# Patient Record
Sex: Male | Born: 1960 | Race: White | Hispanic: No | Marital: Single | Smoking: Former smoker
Health system: Southern US, Community
[De-identification: ages and names within clinical notes are randomized; demographics above are authoritative.]

## PROBLEM LIST (undated history)

## (undated) DIAGNOSIS — F419 Anxiety disorder, unspecified: Secondary | ICD-10-CM

## (undated) DIAGNOSIS — E119 Type 2 diabetes mellitus without complications: Secondary | ICD-10-CM

## (undated) DIAGNOSIS — I10 Essential (primary) hypertension: Secondary | ICD-10-CM

## (undated) HISTORY — PX: CHOLECYSTECTOMY: SHX55

## (undated) HISTORY — PX: COLOSTOMY REVERSAL: SHX5782

## (undated) HISTORY — PX: APPENDECTOMY: SHX54

---

## 2014-01-04 ENCOUNTER — Encounter (HOSPITAL_COMMUNITY): Payer: Self-pay | Admitting: Emergency Medicine

## 2014-01-04 ENCOUNTER — Observation Stay (HOSPITAL_COMMUNITY)
Admission: EM | Admit: 2014-01-04 | Discharge: 2014-01-05 | Disposition: A | Discharge status: Home / Self Care | Payer: Medicare PPO | Attending: Internal Medicine | Admitting: Internal Medicine

## 2014-01-04 ENCOUNTER — Emergency Department (HOSPITAL_COMMUNITY): Payer: Medicare PPO

## 2014-01-04 DIAGNOSIS — E119 Type 2 diabetes mellitus without complications: Secondary | ICD-10-CM | POA: Diagnosis present

## 2014-01-04 DIAGNOSIS — K219 Gastro-esophageal reflux disease without esophagitis: Secondary | ICD-10-CM | POA: Insufficient documentation

## 2014-01-04 DIAGNOSIS — R072 Precordial pain: Principal | ICD-10-CM | POA: Insufficient documentation

## 2014-01-04 DIAGNOSIS — Z79899 Other long term (current) drug therapy: Secondary | ICD-10-CM | POA: Insufficient documentation

## 2014-01-04 DIAGNOSIS — I10 Essential (primary) hypertension: Secondary | ICD-10-CM | POA: Insufficient documentation

## 2014-01-04 DIAGNOSIS — R109 Unspecified abdominal pain: Secondary | ICD-10-CM | POA: Insufficient documentation

## 2014-01-04 DIAGNOSIS — R079 Chest pain, unspecified: Secondary | ICD-10-CM | POA: Diagnosis present

## 2014-01-04 DIAGNOSIS — Z9089 Acquired absence of other organs: Secondary | ICD-10-CM | POA: Insufficient documentation

## 2014-01-04 HISTORY — DX: Essential (primary) hypertension: I10

## 2014-01-04 HISTORY — DX: Type 2 diabetes mellitus without complications: E11.9

## 2014-01-04 HISTORY — DX: Anxiety disorder, unspecified: F41.9

## 2014-01-04 LAB — HEPATIC FUNCTION PANEL
ALT: 15 U/L (ref 0–53)
AST: 16 U/L (ref 0–37)
Albumin: 3.7 g/dL (ref 3.5–5.2)
Alkaline Phosphatase: 91 U/L (ref 39–117)
Total Bilirubin: 0.4 mg/dL (ref 0.3–1.2)
Total Protein: 7.2 g/dL (ref 6.0–8.3)

## 2014-01-04 LAB — BASIC METABOLIC PANEL
BUN: 16 mg/dL (ref 6–23)
CHLORIDE: 101 meq/L (ref 96–112)
CO2: 26 meq/L (ref 19–32)
Calcium: 10.4 mg/dL (ref 8.4–10.5)
Creatinine, Ser: 0.74 mg/dL (ref 0.50–1.35)
GFR calc Af Amer: >90 mL/min (ref >90)
GFR calc non Af Amer: >90 mL/min (ref >90)
GLUCOSE: 164 mg/dL — AB (ref 70–99)
Potassium: 4.1 mEq/L (ref 3.7–5.3)
Sodium: 141 mEq/L (ref 137–147)

## 2014-01-04 LAB — I-STAT TROPONIN, ED
TROPONIN I, POC: 0 ng/mL (ref 0.00–0.08)
Troponin i, poc: 0 ng/mL (ref 0.00–0.08)

## 2014-01-04 LAB — CBC
HCT: 44.2 % (ref 39.0–52.0)
HEMOGLOBIN: 15.5 g/dL (ref 13.0–17.0)
MCH: 29.9 pg (ref 26.0–34.0)
MCHC: 35.1 g/dL (ref 30.0–36.0)
MCV: 85.3 fL (ref 78.0–100.0)
PLATELETS: 235 10*3/uL (ref 150–400)
RBC: 5.18 MIL/uL (ref 4.22–5.81)
RDW: 13.3 % (ref 11.5–15.5)
WBC: 10.1 10*3/uL (ref 4.0–10.5)

## 2014-01-04 LAB — LIPASE, BLOOD: Lipase: 67 U/L — ABNORMAL HIGH (ref 11–59)

## 2014-01-04 LAB — GLUCOSE, CAPILLARY: GLUCOSE-CAPILLARY: 140 mg/dL — AB (ref 70–99)

## 2014-01-04 LAB — D-DIMER, QUANTITATIVE: D-Dimer, Quant: <0.27 ug/mL-FEU (ref 0.00–0.48)

## 2014-01-04 MED ORDER — GI COCKTAIL ~~LOC~~: 30.0000 mL | Freq: Four times a day (QID) | ORAL | Status: DC | PRN

## 2014-01-04 MED ORDER — ASPIRIN EC 325 MG PO TBEC
325.0000 mg | DELAYED_RELEASE_TABLET | Freq: Every day | ORAL | Status: DC
  Administered 2014-01-05 (×2): 325 mg via ORAL
  Filled 2014-01-04 (×2): qty 1

## 2014-01-04 MED ORDER — NITROGLYCERIN 2 % TD OINT
1.0000 [in_us] | TOPICAL_OINTMENT | Freq: Four times a day (QID) | TRANSDERMAL | Status: DC
  Administered 2014-01-04 – 2014-01-05 (×3): 1 [in_us] via TOPICAL
  Filled 2014-01-04: qty 30
  Filled 2014-01-04: qty 1

## 2014-01-04 MED ORDER — FAMOTIDINE 20 MG PO TABS
20.0000 mg | ORAL_TABLET | Freq: Once | ORAL | Status: AC
  Administered 2014-01-04: 20 mg via ORAL
  Filled 2014-01-04: qty 1

## 2014-01-04 MED ORDER — LISINOPRIL 20 MG PO TABS
20.0000 mg | ORAL_TABLET | Freq: Every day | ORAL | Status: DC
  Administered 2014-01-05: 20 mg via ORAL
  Filled 2014-01-04: qty 1

## 2014-01-04 MED ORDER — MORPHINE SULFATE 4 MG/ML IJ SOLN: 4.0000 mg | INTRAMUSCULAR | Status: DC | PRN

## 2014-01-04 MED ORDER — GI COCKTAIL ~~LOC~~
30.0000 mL | Freq: Once | ORAL | Status: AC
  Administered 2014-01-04: 30 mL via ORAL
  Filled 2014-01-04: qty 30

## 2014-01-04 MED ORDER — NITROGLYCERIN 0.4 MG SL SUBL
0.4000 mg | SUBLINGUAL_TABLET | SUBLINGUAL | Status: DC | PRN
  Administered 2014-01-04: 0.4 mg via SUBLINGUAL

## 2014-01-04 MED ORDER — MORPHINE SULFATE 2 MG/ML IJ SOLN: 2.0000 mg | INTRAMUSCULAR | Status: DC | PRN

## 2014-01-04 MED ORDER — GI COCKTAIL ~~LOC~~
30.0000 mL | Freq: Once | ORAL | Status: DC
Start: 2014-01-04 — End: 2014-01-04

## 2014-01-04 MED ORDER — PANTOPRAZOLE SODIUM 40 MG PO TBEC
40.0000 mg | DELAYED_RELEASE_TABLET | Freq: Every day | ORAL | Status: DC
  Administered 2014-01-05 (×2): 40 mg via ORAL
  Filled 2014-01-04 (×2): qty 1

## 2014-01-04 MED ORDER — MORPHINE SULFATE 4 MG/ML IJ SOLN
6.0000 mg | Freq: Once | INTRAMUSCULAR | Status: DC
  Filled 2014-01-04: qty 2

## 2014-01-04 NOTE — ED Notes (Signed)
Attempted report 

## 2014-01-04 NOTE — H&P (Signed)
Chief Complaint:  Chest pain  HPI: 53 yo male h/o dm, htn comes in with sscp/epigastric pain that he normally has usually relieved with rolaids, but this time it was more severe and would not go away.  No radiation.  No n/v.  No cough or sob.  More of a burning sensation.  Pain was relieved some by ntg sublingual than totally relieved after a gi cocktail.  He has heartburn frequently and does not take anything but tums for it.  No swelling in his legs, no calf pain, no edema.  He is pain free now.    Review of Systems:  Positive and negative as per HPI otherwise all other systems are negative  Past Medical History: Past Medical History  Diagnosis Date  . Hypertension   . Diabetes mellitus without complication    Past Surgical History  Procedure Laterality Date  . Cholecystectomy    . Appendectomy    . Colostomy reversal      Medications: Prior to Admission medications   Medication Sig Start Date End Date Taking? Authorizing Provider  BUSPIRONE HCL PO Take 1 tablet by mouth 3 (three) times daily.   Yes Historical Provider, MD  Ca Carbonate-Mag Hydroxide (ROLAIDS PO) Take 2 tablets by mouth daily as needed (acid indigestion).   Yes Historical Provider, MD  ClonazePAM (KLONOPIN PO) Take 1 tablet by mouth at bedtime.   Yes Historical Provider, MD  KRILL OIL PO Take 1 tablet by mouth daily.   Yes Historical Provider, MD  lisinopril (PRINIVIL,ZESTRIL) 20 MG tablet Take 20 mg by mouth daily.   Yes Historical Provider, MD  METFORMIN HCL PO Take 1 tablet by mouth 2 (two) times daily.   Yes Historical Provider, MD  PRESCRIPTION MEDICATION Take 1 tablet by mouth at bedtime. For nerves   Yes Historical Provider, MD    Allergies:  No Known Allergies  Social History:  reports that he has never smoked. He does not have any smokeless tobacco history on file. He reports that he does not drink alcohol or use illicit drugs.  Family History: none  Physical Exam: Filed Vitals:   01/04/14  1830 01/04/14 1845 01/04/14 1900 01/04/14 1919  BP: 184/94 177/98 182/102 159/95  Pulse: 106 114 114 112  Temp:    99 F (37.2 C)  TempSrc:      Resp: 21 22 23 25   Height:      Weight:      SpO2: 95% 96% 97% 97%   General appearance: alert, cooperative and no distress Head: Normocephalic, without obvious abnormality, atraumatic Eyes: negative Nose: Nares normal. Septum midline. Mucosa normal. No drainage or sinus tenderness. Neck: no JVD and supple, symmetrical, trachea midline Lungs: clear to auscultation bilaterally Heart: regular rate and rhythm, S1, S2 normal, no murmur, click, rub or gallop Abdomen: soft, non-tender; bowel sounds normal; no masses,  no organomegaly Extremities: extremities normal, atraumatic, no cyanosis or edema Pulses: 2+ and symmetric Skin: Skin color, texture, turgor normal. No rashes or lesions Neurologic: Grossly normal  Labs on Admission:   Recent Labs  01/04/14 1807  NA 141  K 4.1  CL 101  CO2 26  GLUCOSE 164*  BUN 16  CREATININE 0.74  CALCIUM 10.4    Recent Labs  01/04/14 1807  WBC 10.1  HGB 15.5  HCT 44.2  MCV 85.3  PLT 235    Radiological Exams on Admission: Dg Chest 2 View  01/04/2014   CLINICAL DATA:  Chest pain.  EXAM: CHEST  2  VIEW  COMPARISON:  None.  FINDINGS: Cardiopericardial silhouette within normal limits. Mediastinal contours normal. Trachea midline. No airspace disease or effusion. Monitoring leads project over the chest. Linear scarring or atelectasis is present over the cardiac apex. Cholecystectomy clips are present in the right upper quadrant.  IMPRESSION: No active cardiopulmonary disease.   Electronically Signed   By: Andreas NewportGeoffrey  Lamke M.D.   On: 01/04/2014 19:18    Assessment/Plan  53 yo male with atypical chest pain with multiple risk factors for underlying CAD  Principal Problem:   Chest pain-  obs for romi.  Already has 2 sets of trop that are 0.  ekg no acute changes.  Ck echo in am.  Then d/c with  further outpt stress testing.  Suspect this is GI related,  Will ck lfts/lipase.  Place on protonix orally.  Active Problems:  Stable unless o/w noted:   Diabetes mellitus without complication   Hypertension   GERD (gastroesophageal reflux disease)  obs on tele.  Full code.  Bon Dowis A 01/04/2014, 8:36 PM

## 2014-01-04 NOTE — ED Notes (Signed)
Received pt from home with c/o epigastric/chest pain onset today. Pt tried Rolaids without relief. Pt initial BP 238/120 for EMS. Pt given 324 Mg of ASA and 1 Nitro by EMS. BP decreased to 160/88.

## 2014-01-04 NOTE — ED Notes (Signed)
Pt has been pain free since GI cocktail.  Dr Onalee Huaavid in room with pt.

## 2014-01-04 NOTE — ED Provider Notes (Signed)
CSN: [id number]     Arrival date & time 01/04/14  1724 History   First MD Initiated Contact with Patient 01/04/14 1731     Chief Complaint  Patient presents with  . Chest Pain  . Abdominal Pain     (Consider location/radiation/quality/duration/timing/severity/associated sxs/prior Treatment) Patient is a 53 y.o. male presenting with chest pain.  Chest Pain Pain location:  Substernal area Pain quality: pressure and sharp   Pain radiates to:  Does not radiate Pain radiates to the back: no   Pain severity:  Mild Onset quality:  Gradual Duration:  60 minutes Timing:  Constant Chronicity:  New Context: not breathing, not eating and not at rest   Relieved by:  None tried Worsened by:  Nothing tried Ineffective treatments:  Antacids Associated symptoms: no abdominal pain, no back pain, no cough, no dizziness, no fatigue, no fever, no headache, no numbness, no orthopnea, no palpitations, no shortness of breath and not vomiting     Past Medical History  Diagnosis Date  . Hypertension   . Diabetes mellitus without complication    Past Surgical History  Procedure Laterality Date  . Cholecystectomy    . Appendectomy    . Colostomy reversal     No family history on file. History  Substance Use Topics  . Smoking status: Never Smoker   . Smokeless tobacco: Not on file  . Alcohol Use: No    Review of Systems  Constitutional: Negative for fever, chills and fatigue.  HENT: Negative for congestion and rhinorrhea.   Eyes: Negative for pain.  Respiratory: Negative for cough and shortness of breath.   Cardiovascular: Positive for chest pain. Negative for palpitations and orthopnea.  Gastrointestinal: Negative for vomiting, abdominal pain, diarrhea and constipation.  Endocrine: Negative for polydipsia and polyuria.  Genitourinary: Negative for dysuria and flank pain.  Musculoskeletal: Negative for back pain and neck pain.  Skin: Negative for color change and wound.  Neurological:  Negative for dizziness, numbness and headaches.      Allergies  Review of patient's allergies indicates no known allergies.  Home Medications   Prior to Admission medications   Medication Sig Start Date End Date Taking? Authorizing Provider  BUSPIRONE HCL PO Take 1 tablet by mouth 3 (three) times daily.   Yes Historical Provider, MD  Ca Carbonate-Mag Hydroxide (ROLAIDS PO) Take 2 tablets by mouth daily as needed (acid indigestion).   Yes Historical Provider, MD  ClonazePAM (KLONOPIN PO) Take 1 tablet by mouth at bedtime.   Yes Historical Provider, MD  KRILL OIL PO Take 1 tablet by mouth daily.   Yes Historical Provider, MD  lisinopril (PRINIVIL,ZESTRIL) 20 MG tablet Take 20 mg by mouth daily.   Yes Historical Provider, MD  METFORMIN HCL PO Take 1 tablet by mouth 2 (two) times daily.   Yes Historical Provider, MD  PRESCRIPTION MEDICATION Take 1 tablet by mouth at bedtime. For nerves   Yes Historical Provider, MD   BP 159/95  Pulse 112  Temp(Src) 99 F (37.2 C) (Oral)  Resp 25  Ht 5\' 8"  (1.727 m)  Wt 184 lb (83.462 kg)  BMI 27.98 kg/m2  SpO2 97% Physical Exam  Nursing note and vitals reviewed. Constitutional: He is oriented to person, place, and time. He appears well-developed and well-nourished.  HENT:  Head: Normocephalic and atraumatic.  Eyes: Conjunctivae and EOM are normal. Pupils are equal, round, and reactive to light.  Neck: Normal range of motion.  Cardiovascular: Normal rate and regular rhythm.   Pulmonary/Chest:  Effort normal and breath sounds normal.  Abdominal: Soft. He exhibits no distension. There is no tenderness.  Musculoskeletal: Normal range of motion. He exhibits no edema and no tenderness.  Neurological: He is alert and oriented to person, place, and time.  Skin: Skin is warm and dry.    ED Course  Procedures (including critical care time) Labs Review Labs Reviewed  BASIC METABOLIC PANEL - Abnormal; Notable for the following:    Glucose, Bld 164 (*)     All other components within normal limits  CBC  D-DIMER, QUANTITATIVE  I-STAT TROPOININ, ED  Rosezena SensorI-STAT TROPOININ, ED    Imaging Review Dg Chest 2 View  01/04/2014   CLINICAL DATA:  Chest pain.  EXAM: CHEST  2 VIEW  COMPARISON:  None.  FINDINGS: Cardiopericardial silhouette within normal limits. Mediastinal contours normal. Trachea midline. No airspace disease or effusion. Monitoring leads project over the chest. Linear scarring or atelectasis is present over the cardiac apex. Cholecystectomy clips are present in the right upper quadrant.  IMPRESSION: No active cardiopulmonary disease.   Electronically Signed   By: Andreas NewportGeoffrey  Lamke M.D.   On: 01/04/2014 19:18     EKG Interpretation   Date/Time:  Sunday January 04 2014 17:34:13 EDT Ventricular Rate:  106 PR Interval:  126 QRS Duration: 94 QT Interval:  327 QTC Calculation: 434 R Axis:   -7 Text Interpretation:  Sinus tachycardia Abnormal R-wave progression, early  transition Confirmed by ZAVITZ  MD, JOSHUA ([id number]) on 01/04/2014 5:58:26 PM      MDM   Final diagnoses:  None    53 yo w/ h/o HTN, HLD, DM here with substernal cp not relieved by antacids. No n/v, sob, diaphoresis. Similar to previous heartburn but not relieved and slightly worse. Appears well, tachycardic and tachypneic, d dimer negative. ecg sinus tach. cxr normal. Pain improved with ntg. Went away with morphine and GI cocktail. Likely GI, however with multiple risk factors, low risk acs rule out seems appropriate. Admitted to hospitalist.     Marily MemosJason Kyndel Egger, MD 01/05/14 0100

## 2014-01-04 NOTE — ED Notes (Signed)
Report to CN on 3W

## 2014-01-05 DIAGNOSIS — I059 Rheumatic mitral valve disease, unspecified: Secondary | ICD-10-CM

## 2014-01-05 LAB — GLUCOSE, CAPILLARY
GLUCOSE-CAPILLARY: 125 mg/dL — AB (ref 70–99)
GLUCOSE-CAPILLARY: 164 mg/dL — AB (ref 70–99)

## 2014-01-05 MED ORDER — ASPIRIN 325 MG PO TBEC: 325.0000 mg | DELAYED_RELEASE_TABLET | Freq: Every day | ORAL | Status: AC

## 2014-01-05 MED ORDER — PANTOPRAZOLE SODIUM 40 MG PO TBEC: 40.0000 mg | DELAYED_RELEASE_TABLET | Freq: Every day | ORAL | Status: AC

## 2014-01-05 NOTE — Progress Notes (Signed)
2D Echocardiogram Complete.  01/05/2014   Anacarolina Evelyn, RDCS  

## 2014-01-05 NOTE — Discharge Instructions (Signed)
Gastroesophageal Reflux Disease, Adult Gastroesophageal reflux disease (GERD) happens when acid from your stomach goes into your food pipe (esophagus). The acid can cause a burning feeling in your chest. Over time, the acid can make small holes (ulcers) in your food pipe.  HOME CARE  Ask your doctor for advice about:  Losing weight.  Quitting smoking.  Alcohol use.  Avoid foods and drinks that make your problems worse. You may want to avoid:  Caffeine and alcohol.  Chocolate.  Mints.  Garlic and onions.  Spicy foods.  Citrus fruits, such as oranges, lemons, or limes.  Foods that contain tomato, such as sauce, chili, salsa, and pizza.  Fried and fatty foods.  Avoid lying down for 3 hours before you go to bed or before you take a nap.  Eat small meals often, instead of large meals.  Wear loose-fitting clothing. Do not wear anything tight around your waist.  Raise (elevate) the head of your bed 6 to 8 inches with wood blocks. Using extra pillows does not help.  Only take medicines as told by your doctor.  Do not take aspirin or ibuprofen. GET HELP RIGHT AWAY IF:   You have pain in your arms, neck, jaw, teeth, or back.  Your pain gets worse or changes.  You feel sick to your stomach (nauseous), throw up (vomit), or sweat (diaphoresis).  You feel short of breath, or you pass out (faint).  Your throw up is green, yellow, black, or looks like coffee grounds or blood.  Your poop (stool) is red, bloody, or black. MAKE SURE YOU:   Understand these instructions.  Will watch your condition.  Will get help right away if you are not doing well or get worse. Document Released: 12/27/2007 Document Revised: 10/02/2011 Document Reviewed: 01/27/2011 [facility] Patient Information 2014 [place], [place].    Chest Pain (Nonspecific) Chest pain has many causes. Your pain could be caused by something serious, such as a heart attack or a blood clot in the lungs. It could also  be caused by something less serious, such as a chest bruise or a virus. Follow up with your doctor. More lab tests or other studies may be needed to find the cause of your pain. Most of the time, nonspecific chest pain will improve within 2 to 3 days of rest and mild pain medicine. HOME CARE  For chest bruises, you may put ice on the sore area for 15-20 minutes, 03-04 times a day. Do this only if it makes you feel better.  Put ice in a plastic bag.  Place a towel between the skin and the bag.  Rest for the next 2 to 3 days.  Go back to work if the pain improves.  See your doctor if the pain lasts longer than 1 to 2 weeks.  Only take medicine as told by your doctor.  Quit smoking if you smoke. GET HELP RIGHT AWAY IF:   There is more pain or pain that spreads to the arm, neck, jaw, back, or belly (abdomen).  You have shortness of breath.  You cough more than usual or cough up blood.  You have very bad back or belly pain, feel sick to your stomach (nauseous), or throw up (vomit).  You have very bad weakness.  You pass out (faint).  You have a fever. Any of these problems may be serious and may be an emergency. Do not wait to see if the problems will go away. Get medical help right away. Call your  local emergency services 911 in [place] Do not drive yourself to the hospital. MAKE SURE YOU:   Understand these instructions.  Will watch this condition.  Will get help right away if you or your child is not doing well or gets worse. Document Released: 12/27/2007 Document Revised: 10/02/2011 Document Reviewed: 12/27/2007 [facility] Patient Information 2014 [place], [place].

## 2014-01-05 NOTE — Progress Notes (Signed)
Reviewed discharge instructions with patient and family, they stated their understanding.  Discharged home with family via wheelchair.  Colman Caterarpley, Tally Mattox Danielle

## 2014-01-05 NOTE — Progress Notes (Signed)
Chest pain rounds: No further chest discomfort.  Cardiac enzymes remained negative.  EKG shows no acute ischemic ST changes although he has unusually prominent R wave in V1.  Cardiac exam shows clear lung fields and no murmur gallop rub or click.  Agree with plan for echocardiogram and then home with subsequent outpatient treadmill Myoview stress test

## 2014-01-05 NOTE — Discharge Summary (Signed)
Physician Discharge Summary  David Holloway ZOX:[id number] DOB: 01/28/1961 DOA: 01/04/2014  PCP: No primary provider on file.  Admit date: 01/04/2014 Discharge date: 01/05/2014  Time spent: 35 minutes  Recommendations for Outpatient Follow-up:  1. Follow up with PCP in 1-2 weeks 2. Follow up for outpatient stress test 3. May consider outpatient referral to GI for GERD  Discharge Diagnoses:  Principal Problem:   Chest pain Active Problems:   Diabetes mellitus without complication   Hypertension   GERD (gastroesophageal reflux disease)   Discharge Condition: Stable  Diet recommendation: Diabetic  Filed Weights   01/04/14 1737 01/04/14 2158  Weight: 184 lb (83.462 kg) 183 lb 9.6 oz (83.28 kg)    History of present illness:  53 yo male h/o dm, htn comes in with sscp/epigastric pain that he normally has usually relieved with rolaids, but this time it was more severe and would not go away. No radiation. No n/v. No cough or sob. More of a burning sensation. Pain was relieved some by ntg sublingual than totally relieved after a gi cocktail. He has heartburn frequently and does not take anything but tums for it. No swelling in his legs, no calf pain, no edema. He is pain free now.   Hospital Course:  The patient was admitted to the floor. Cardiac enzymes were serially negative. Cardiology was consulted with recommendations for outpatient stress test if 2D echo unremarkable. 2D echo was unremarkable with EF of 50-55% with no wall motion abnormality.The patient remained medically stable for discharge and close follow up. He will be discharged with a PPI given concerns of possible GERD related chest pain.  Procedures:  2D echo 6/15  Consultations:  Cardiology  Discharge Exam: Filed Vitals:   01/04/14 2158 01/05/14 0500 01/05/14 1025 01/05/14 1327  BP: 157/88 125/77 149/78 150/72  Pulse: 86 77 83 92  Temp: 98.2 F (36.8 C) 97.6 F (36.4 C)  97.8 F (36.6 C)  TempSrc:    Oral   Resp: 18 16  18   Height: 5\' 8"  (1.727 m)     Weight: 183 lb 9.6 oz (83.28 kg)     SpO2: 97% 100%  99%    General: Awake, in nad Cardiovascular: regular, s1, s2 Respiratory: normal resp effort, no wheezing  Discharge Instructions     Medication List         aspirin 325 MG EC tablet  Take 1 tablet (325 mg total) by mouth daily.     busPIRone 5 MG tablet  Commonly known as:  BUSPAR  Take 5 mg by mouth 3 (three) times daily.     clonazePAM 0.5 MG tablet  Commonly known as:  KLONOPIN  Take 0.5 mg by mouth 2 (two) times daily.     doxepin 75 MG capsule  Commonly known as:  SINEQUAN  Take 150 mg by mouth at bedtime.     KRILL OIL PO  Take 1 tablet by mouth daily.     lisinopril 20 MG tablet  Commonly known as:  PRINIVIL,ZESTRIL  Take 20 mg by mouth daily.     metFORMIN 500 MG tablet  Commonly known as:  GLUCOPHAGE  Take 500 mg by mouth 2 (two) times daily with a meal.     pantoprazole 40 MG tablet  Commonly known as:  PROTONIX  Take 1 tablet (40 mg total) by mouth daily.     ROLAIDS PO  Take 2 tablets by mouth daily as needed (acid indigestion).       No  Known Allergies Follow-up Information   Schedule an appointment as soon as possible for a visit with follow up with your PCP in 1-2 weeks.      Follow up with You will be called to schedule an outpatient stress test.       The results of significant diagnostics from this hospitalization (including imaging, microbiology, ancillary and laboratory) are listed below for reference.    Significant Diagnostic Studies: Dg Chest 2 View  01/04/2014   CLINICAL DATA:  Chest pain.  EXAM: CHEST  2 VIEW  COMPARISON:  None.  FINDINGS: Cardiopericardial silhouette within normal limits. Mediastinal contours normal. Trachea midline. No airspace disease or effusion. Monitoring leads project over the chest. Linear scarring or atelectasis is present over the cardiac apex. Cholecystectomy clips are present in the right upper  quadrant.  IMPRESSION: No active cardiopulmonary disease.   Electronically Signed   By: Andreas NewportGeoffrey  Lamke M.D.   On: 01/04/2014 19:18    Microbiology: No results found for this or any previous visit (from the past 240 hour(s)).   Labs: Basic Metabolic Panel:  Recent Labs Lab 01/04/14 1807  NA 141  K 4.1  CL 101  CO2 26  GLUCOSE 164*  BUN 16  CREATININE 0.74  CALCIUM 10.4   Liver Function Tests:  Recent Labs Lab 01/04/14 1807  AST 16  ALT 15  ALKPHOS 91  BILITOT 0.4  PROT 7.2  ALBUMIN 3.7    Recent Labs Lab 01/04/14 1807  LIPASE 67*   No results found for this basename: AMMONIA,  in the last 168 hours CBC:  Recent Labs Lab 01/04/14 1807  WBC 10.1  HGB 15.5  HCT 44.2  MCV 85.3  PLT 235   Cardiac Enzymes: No results found for this basename: CKTOTAL, CKMB, CKMBINDEX, TROPONINI,  in the last 168 hours BNP: BNP (last 3 results) No results found for this basename: PROBNP,  in the last 8760 hours CBG:  Recent Labs Lab 01/04/14 2148 01/05/14 0736 01/05/14 1137  GLUCAP 140* 125* 164*    Signed:  CHIU, STEPHEN K  [facility] 01/05/2014, 3:35 PM

## 2014-01-05 NOTE — Progress Notes (Signed)
UR Completed.  David Holloway [name] [phone] 01/05/2014  

## 2014-01-08 NOTE — ED Provider Notes (Signed)
Medical screening examination/treatment/procedure(s) were conducted as a shared visit with non-physician practitioner(s) or resident and myself. I personally evaluated the patient during the encounter and agree with the findings and plan unless otherwise indicated.  I have personally reviewed any xrays and/ or EKG's with the provider and I agree with interpretation.  Patient with history of high blood pressure, obesity, diabetes, cholesterol presents with epigastric and lower anterior chest burning and ache stay to 3 hours prior to arrival. Patient says this feels different than previous episodes of reflux however does have mild similarities. No known CAD a recent cardiac evaluation. On exam patient well-appearing, symptoms mild at this time, abdomen soft nontender, regular rate and rhythm, lungs clear, no leg pain leg swelling. Patient tachycardic in the room without any known cause of his chest pain and low risk on embolism plan for d-dimer and cardiac workup. With 3 cardiac risk factors and high blood pressure and recent chest pain plan for observation and telemetry pending results. Mild improvement nitroglycerin we'll try GI cocktail as well.  Pt does not have his GB/  Chest pain, HTN   David SkeensJoshua M Eleanore Junio, MD 01/08/14 [phone]

## 2015-12-15 ENCOUNTER — Emergency Department (HOSPITAL_COMMUNITY): Payer: Medicare PPO

## 2015-12-15 ENCOUNTER — Encounter (HOSPITAL_COMMUNITY): Payer: Self-pay | Admitting: Emergency Medicine

## 2015-12-15 ENCOUNTER — Emergency Department (HOSPITAL_COMMUNITY)
Admission: EM | Admit: 2015-12-15 | Discharge: 2015-12-15 | Disposition: A | Discharge status: Home / Self Care | Payer: Medicare PPO | Attending: Emergency Medicine | Admitting: Emergency Medicine

## 2015-12-15 DIAGNOSIS — F419 Anxiety disorder, unspecified: Secondary | ICD-10-CM | POA: Diagnosis not present

## 2015-12-15 DIAGNOSIS — Z79899 Other long term (current) drug therapy: Secondary | ICD-10-CM | POA: Insufficient documentation

## 2015-12-15 DIAGNOSIS — Z7982 Long term (current) use of aspirin: Secondary | ICD-10-CM | POA: Insufficient documentation

## 2015-12-15 DIAGNOSIS — S62306A Unspecified fracture of fifth metacarpal bone, right hand, initial encounter for closed fracture: Secondary | ICD-10-CM | POA: Insufficient documentation

## 2015-12-15 DIAGNOSIS — Y92410 Unspecified street and highway as the place of occurrence of the external cause: Secondary | ICD-10-CM | POA: Insufficient documentation

## 2015-12-15 DIAGNOSIS — I10 Essential (primary) hypertension: Secondary | ICD-10-CM | POA: Diagnosis not present

## 2015-12-15 DIAGNOSIS — Y9389 Activity, other specified: Secondary | ICD-10-CM | POA: Insufficient documentation

## 2015-12-15 DIAGNOSIS — E119 Type 2 diabetes mellitus without complications: Secondary | ICD-10-CM | POA: Diagnosis not present

## 2015-12-15 DIAGNOSIS — S6991XA Unspecified injury of right wrist, hand and finger(s), initial encounter: Secondary | ICD-10-CM | POA: Diagnosis present

## 2015-12-15 DIAGNOSIS — Y998 Other external cause status: Secondary | ICD-10-CM | POA: Insufficient documentation

## 2015-12-15 DIAGNOSIS — Z7984 Long term (current) use of oral hypoglycemic drugs: Secondary | ICD-10-CM | POA: Insufficient documentation

## 2015-12-15 DIAGNOSIS — S60512A Abrasion of left hand, initial encounter: Secondary | ICD-10-CM | POA: Diagnosis not present

## 2015-12-15 DIAGNOSIS — S62308A Unspecified fracture of other metacarpal bone, initial encounter for closed fracture: Secondary | ICD-10-CM

## 2015-12-15 LAB — COMPREHENSIVE METABOLIC PANEL
ALK PHOS: 83 U/L (ref 38–126)
ALT: 18 U/L (ref 17–63)
AST: 16 U/L (ref 15–41)
Albumin: 3.3 g/dL — ABNORMAL LOW (ref 3.5–5.0)
Anion gap: 10 (ref 5–15)
BUN: 27 mg/dL — ABNORMAL HIGH (ref 6–20)
CALCIUM: 9 mg/dL (ref 8.9–10.3)
CO2: 24 mmol/L (ref 22–32)
Chloride: 103 mmol/L (ref 101–111)
Creatinine, Ser: 0.96 mg/dL (ref 0.61–1.24)
Glucose, Bld: 169 mg/dL — ABNORMAL HIGH (ref 65–99)
Potassium: 2.9 mmol/L — ABNORMAL LOW (ref 3.5–5.1)
SODIUM: 137 mmol/L (ref 135–145)
Total Bilirubin: 1 mg/dL (ref 0.3–1.2)
Total Protein: 6.5 g/dL (ref 6.5–8.1)

## 2015-12-15 LAB — CBC WITH DIFFERENTIAL/PLATELET
BASOS ABS: 0 10*3/uL (ref 0.0–0.1)
BASOS PCT: 0 %
EOS ABS: 0.5 10*3/uL (ref 0.0–0.7)
EOS PCT: 3 %
HCT: 43.6 % (ref 39.0–52.0)
HEMOGLOBIN: 15.7 g/dL (ref 13.0–17.0)
LYMPHS ABS: 2 10*3/uL (ref 0.7–4.0)
Lymphocytes Relative: 13 %
MCH: 29.8 pg (ref 26.0–34.0)
MCHC: 36 g/dL (ref 30.0–36.0)
MCV: 82.9 fL (ref 78.0–100.0)
Monocytes Absolute: 1.1 10*3/uL — ABNORMAL HIGH (ref 0.1–1.0)
Monocytes Relative: 7 %
NEUTROS PCT: 77 %
Neutro Abs: 11.2 10*3/uL — ABNORMAL HIGH (ref 1.7–7.7)
PLATELETS: 241 10*3/uL (ref 150–400)
RBC: 5.26 MIL/uL (ref 4.22–5.81)
RDW: 12.9 % (ref 11.5–15.5)
WBC: 14.7 10*3/uL — AB (ref 4.0–10.5)

## 2015-12-15 LAB — LIPASE, BLOOD: LIPASE: 24 U/L (ref 11–51)

## 2015-12-15 MED ORDER — FENTANYL CITRATE (PF) 100 MCG/2ML IJ SOLN
100.0000 ug | Freq: Once | INTRAMUSCULAR | Status: AC
  Administered 2015-12-15: 100 ug via INTRAVENOUS
  Filled 2015-12-15: qty 2

## 2015-12-15 MED ORDER — SODIUM CHLORIDE 0.9 % IV SOLN
Freq: Once | INTRAVENOUS | Status: AC
  Administered 2015-12-15: 19:00:00 via INTRAVENOUS

## 2015-12-15 MED ORDER — POTASSIUM CHLORIDE CRYS ER 20 MEQ PO TBCR
60.0000 meq | EXTENDED_RELEASE_TABLET | Freq: Once | ORAL | Status: AC
  Administered 2015-12-15: 60 meq via ORAL
  Filled 2015-12-15: qty 3

## 2015-12-15 NOTE — ED Notes (Signed)
Patient ambulatory with stable and steady gait to the bathroom with only standby assistance.  Patient denies dizziness.

## 2015-12-15 NOTE — ED Notes (Signed)
Ortho notified to apply splint  

## 2015-12-15 NOTE — Progress Notes (Signed)
Orthopedic Tech Progress Note Patient Details:  [facility] 03/11/1961 [id number]  Ortho Devices Type of Ortho Device: Ace wrap, Ulna gutter splint Ortho Device/Splint Location: RUE Ortho Device/Splint Interventions: Ordered, Application   Jennye MoccasinHughes, [name] 12/15/2015, 9:22 PM
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
## 2016-08-21 ENCOUNTER — Ambulatory Visit: Payer: Medicare PPO | Attending: Orthopedic Surgery | Admitting: Physical Therapy

## 2016-08-21 DIAGNOSIS — M25611 Stiffness of right shoulder, not elsewhere classified: Secondary | ICD-10-CM | POA: Insufficient documentation

## 2016-08-21 DIAGNOSIS — G8929 Other chronic pain: Secondary | ICD-10-CM | POA: Insufficient documentation

## 2016-08-21 DIAGNOSIS — M25511 Pain in right shoulder: Secondary | ICD-10-CM | POA: Insufficient documentation
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
## 2016-08-21 NOTE — Patient Instructions (Signed)
Instructed patient in supine active-assistive flexion.

## 2016-08-24 ENCOUNTER — Encounter: Payer: Self-pay | Admitting: Physical Therapy

## 2016-08-24 ENCOUNTER — Ambulatory Visit: Payer: No Typology Code available for payment source | Attending: Orthopedic Surgery | Admitting: Physical Therapy

## 2016-08-24 DIAGNOSIS — M25511 Pain in right shoulder: Secondary | ICD-10-CM | POA: Insufficient documentation

## 2016-08-24 DIAGNOSIS — G8929 Other chronic pain: Secondary | ICD-10-CM | POA: Insufficient documentation

## 2016-08-24 DIAGNOSIS — M25611 Stiffness of right shoulder, not elsewhere classified: Secondary | ICD-10-CM | POA: Insufficient documentation

## 2016-08-24 NOTE — Therapy (Signed)
[facility] [address] [place], [place], [address] Phone: [phone]   Fax:  [phone]  Physical Therapy Treatment  Patient Details  Name: David Holloway MRN: [id number] Date of Birth: October 23, 1960 Referring Provider: Francena Hanly MD.  Encounter Date: 08/24/2016      PT End of Session - 08/24/16 1253    Visit Number 2   Number of Visits 12   Date for PT Re-Evaluation 10/02/16   PT Start Time 1230   PT Stop Time 1300   PT Time Calculation (min) 30 min   Activity Tolerance Patient tolerated treatment well   Behavior During Therapy [facility] for tasks assessed/performed      Past Medical History:  Diagnosis Date  . Anxiety   . Diabetes mellitus without complication (HCC)   . Hypertension     Past Surgical History:  Procedure Laterality Date  . APPENDECTOMY    . CHOLECYSTECTOMY    . COLOSTOMY REVERSAL      There were no vitals filed for this visit.      Subjective Assessment - 08/24/16 1246    Subjective Pt presenting today with pain reported as 8/10 in R shoulder following a right shoulder SAD/DCR on 08/11/16.    Patient Stated Goals use right arm without pain.   Currently in Pain? Yes   Pain Score 8    Pain Location Shoulder   Pain Orientation Right   Pain Descriptors / Indicators Aching;Throbbing   Pain Type Chronic pain   Pain Onset More than a month ago   Pain Frequency Constant   Aggravating Factors  movements   Pain Relieving Factors resting, ice, heat   Effect of Pain on Daily Activities unable to perform household duties and ADL's                         [facility] Adult PT Treatment/Exercise - 08/24/16 0001      Exercises   Exercises Shoulder     Shoulder Exercises: ROM/Strengthening   Other ROM/Strengthening Exercises PROM/AAROM in supine in all planes, Pt reporting increased pain to 9/10 with movements.      Modalities   Modalities Electrical Stimulation;Moist Heat     Moist Heat  Therapy   Number Minutes Moist Heat 20 Minutes     Electrical Stimulation   Electrical Stimulation Location R shoulder   Electrical Stimulation Action Non-motoric IFC   Electrical Stimulation Parameters 80-150 HZ at 100% scan for 20 minutes   Electrical Stimulation Goals Pain                PT Education - 08/24/16 1252    Education provided Yes   Education Details HEP review   Person(s) Educated Patient   Methods Explanation;Demonstration;Verbal cues   Comprehension Verbalized understanding;Returned demonstration             PT Long Term Goals - 08/21/16 1202      PT LONG TERM GOAL #1   Title Independent with a HEP.   Time 6   Period Weeks   Status New     PT LONG TERM GOAL #2   Title Active right shoulder flexion to 150 degrees so the patient can easily reach overhead   Time 6   Period Weeks   Status New     PT LONG TERM GOAL #3   Title Active ER to 70 degrees+ to allow for easily donning/doffing of apparel   Time 6   Period Weeks  Status New     PT LONG TERM GOAL #4   Title Increase ROM so patient is able to reach behind back to L3.   Time 6   Period Weeks   Status New     PT LONG TERM GOAL #5   Title Increase right shoulder strength to a solid 4+/5 to increase stability for performance of functional activities   Time 6   Period Weeks   Status New     PT LONG TERM GOAL #6   Title Perform ADL's with right shoulder pain not > 3/10.   Time 6   Period Weeks   Status New               Plan - 08/24/16 1254    Clinical Impression Statement Pt patient tolerated well today still reporting pain of 8-9/10 in R shoulder following his SAD/DCR on 08/11/16. Pt still with limited ROM and strength. Skilled PT needed to address impairments and progress pt toward his PLOF and goals set.    Rehab Potential Excellent   PT Frequency 2x / week   PT Duration 6 weeks   PT Treatment/Interventions ADLs/Self Care Home Management;Cryotherapy;Electrical  Stimulation;Moist Heat;Ultrasound;Patient/family education;Therapeutic exercise;Therapeutic activities;Manual techniques;Passive range of motion;Vasopneumatic Device   PT Next Visit Plan Right shoulder PROM; AAROM.  Modalities PRN.   Consulted and Agree with Plan of Care Patient      Patient will benefit from skilled therapeutic intervention in order to improve the following deficits and impairments:  Pain, Decreased activity tolerance, Decreased range of motion  Visit Diagnosis: Chronic right shoulder pain  Stiffness of right shoulder, not elsewhere classified     Problem List Patient Active Problem List   Diagnosis Date Noted  . GERD (gastroesophageal reflux disease) 01/04/2014  . Chest pain 01/04/2014  . Diabetes mellitus without complication (HCC)   . Hypertension     Sharmon LeydenJennifer R Jaquaya Coyle, MPT 08/24/2016, 12:56 PM  [facility] [address] [place], [place], [address] Phone: [phone]   Fax:  [phone]  Name: David Holloway MRN: [id number] Date of Birth: 03/03/1961

## 2016-08-28 ENCOUNTER — Encounter: Payer: Self-pay | Admitting: Physical Therapy

## 2016-08-28 ENCOUNTER — Ambulatory Visit: Payer: No Typology Code available for payment source | Admitting: Physical Therapy

## 2016-08-28 DIAGNOSIS — M25511 Pain in right shoulder: Secondary | ICD-10-CM | POA: Diagnosis not present

## 2016-08-28 DIAGNOSIS — M25611 Stiffness of right shoulder, not elsewhere classified: Secondary | ICD-10-CM

## 2016-08-28 DIAGNOSIS — G8929 Other chronic pain: Secondary | ICD-10-CM

## 2016-08-28 NOTE — Therapy (Signed)
[facility] [address] [place], [place], [address] Phone: [phone]   Fax:  [phone]  Physical Therapy Treatment  Patient Details  Name: David HarderJeffrey W Villwock MRN: [id number] Date of Birth: 04/23/1961 Referring Provider: Francena HanlyKevin Supple MD.  Encounter Date: 08/28/2016      PT End of Session - 08/28/16 1432    Visit Number 3   Number of Visits 12   Date for PT Re-Evaluation 10/02/16   PT Start Time 1432   PT Stop Time 1517   PT Time Calculation (min) 45 min   Activity Tolerance Patient tolerated treatment well   Behavior During Therapy [facility] for tasks assessed/performed      Past Medical History:  Diagnosis Date  . Anxiety   . Diabetes mellitus without complication (HCC)   . Hypertension     Past Surgical History:  Procedure Laterality Date  . APPENDECTOMY    . CHOLECYSTECTOMY    . COLOSTOMY REVERSAL      There were no vitals filed for this visit.      Subjective Assessment - 08/28/16 1431    Subjective Reports soreness in R shoulder today upon arrival.   Patient is accompained by: Family member  Wife   Patient Stated Goals use right arm without pain.   Currently in Pain? Yes   Pain Score 10-Worst pain ever   Pain Location Shoulder   Pain Orientation Right   Pain Descriptors / Indicators Sore   Pain Type Surgical pain   Pain Onset More than a month ago            [facility] - 08/28/16 0001      Assessment   Medical Diagnosis Right shoulder SAD/DCR.   Onset Date/Surgical Date 08/11/16   Next MD Visit 09/15/2016     Restrictions   Weight Bearing Restrictions No                     [facility] Adult PT Treatment/Exercise - 08/28/16 0001      Modalities   Modalities Electrical Stimulation;Moist Heat     Moist Heat Therapy   Number Minutes Moist Heat 15 Minutes   Moist Heat Location Shoulder     Electrical Stimulation   Electrical Stimulation Location R shoulder   Electrical  Stimulation Action IFC   Electrical Stimulation Parameters 1-10 hz x15 min   Electrical Stimulation Goals Pain     Manual Therapy   Manual Therapy Passive ROM   Passive ROM Gentle PROM of R shoulder into flexion/ER/IR with gentle holds at end range and oscillations to promote proper mobility                     PT Long Term Goals - 08/21/16 1202      PT LONG TERM GOAL #1   Title Independent with a HEP.   Time 6   Period Weeks   Status New     PT LONG TERM GOAL #2   Title Active right shoulder flexion to 150 degrees so the patient can easily reach overhead   Time 6   Period Weeks   Status New     PT LONG TERM GOAL #3   Title Active ER to 70 degrees+ to allow for easily donning/doffing of apparel   Time 6   Period Weeks   Status New     PT LONG TERM GOAL #4   Title Increase ROM so patient is able to reach behind back  to L3.   Time 6   Period Weeks   Status New     PT LONG TERM GOAL #5   Title Increase right shoulder strength to a solid 4+/5 to increase stability for performance of functional activities   Time 6   Period Weeks   Status New     PT LONG TERM GOAL #6   Title Perform ADL's with right shoulder pain not > 3/10.   Time 6   Period Weeks   Status New               Plan - 08/28/16 1508    Clinical Impression Statement Patient tolerated today's treatment fairly well as he arrived with increased R shoulder pain. Patient able to tolerate gentle PROM of R shoulder with frequent oscillations to promote relaxation and VCs to encourage R shoulder relaxation. Patient arrived in clinic without sling donned. Patient and wife educated that taking pain medication as prescribed prior to treatment may help with pain control in PT. Patient presented with increased facial grimacing and discomfort approximately around 90 deg flexion. Normal modalities response noted following removal of the modalities.   Rehab Potential Excellent   PT Frequency 2x / week    PT Duration 6 weeks   PT Treatment/Interventions ADLs/Self Care Home Management;Cryotherapy;Electrical Stimulation;Moist Heat;Ultrasound;Patient/family education;Therapeutic exercise;Therapeutic activities;Manual techniques;Passive range of motion;Vasopneumatic Device   PT Next Visit Plan Right shoulder PROM; AAROM.  Modalities PRN.   Consulted and Agree with Plan of Care Patient;Family member/caregiver   Family Member Consulted Wife      Patient will benefit from skilled therapeutic intervention in order to improve the following deficits and impairments:  Pain, Decreased activity tolerance, Decreased range of motion  Visit Diagnosis: Chronic right shoulder pain  Stiffness of right shoulder, not elsewhere classified     Problem List Patient Active Problem List   Diagnosis Date Noted  . GERD (gastroesophageal reflux disease) 01/04/2014  . Chest pain 01/04/2014  . Diabetes mellitus without complication (HCC)   . Hypertension     Evelene Croon, PTA 08/28/2016, 3:22 PM  [facility] [address] [place], [place], [address] Phone: [phone]   Fax:  [phone]  Name: RAMZY CAPPELLETTI MRN: [id number] Date of Birth: 07-05-1961

## 2016-08-31 ENCOUNTER — Encounter: Payer: Self-pay | Admitting: Physical Therapy

## 2016-08-31 ENCOUNTER — Ambulatory Visit: Payer: No Typology Code available for payment source | Admitting: Physical Therapy

## 2016-08-31 DIAGNOSIS — M25511 Pain in right shoulder: Principal | ICD-10-CM

## 2016-08-31 DIAGNOSIS — G8929 Other chronic pain: Secondary | ICD-10-CM

## 2016-08-31 DIAGNOSIS — M25611 Stiffness of right shoulder, not elsewhere classified: Secondary | ICD-10-CM

## 2016-08-31 LAB — BASIC METABOLIC PANEL: Glucose: 82 mg/dL

## 2016-08-31 NOTE — Therapy (Signed)
[facility] [address] [place], [place], [address] Phone: [phone]   Fax:  [phone]  Physical Therapy Treatment  Patient Details  Name: David HarderJeffrey W Gelder MRN: [id number] Date of Birth: 09/16/1960 Referring Provider: Francena HanlyKevin Supple MD.  Encounter Date: 08/31/2016      PT End of Session - 08/31/16 1156    Visit Number 4   Number of Visits 12   Date for PT Re-Evaluation 10/02/16   PT Start Time 1121   PT Stop Time 1202   PT Time Calculation (min) 41 min   Activity Tolerance Patient tolerated treatment well   Behavior During Therapy [facility] for tasks assessed/performed      Past Medical History:  Diagnosis Date  . Anxiety   . Diabetes mellitus without complication (HCC)   . Hypertension     Past Surgical History:  Procedure Laterality Date  . APPENDECTOMY    . CHOLECYSTECTOMY    . COLOSTOMY REVERSAL      There were no vitals filed for this visit.      Subjective Assessment - 08/31/16 1155    Subjective Reports R shoulder still sore but he took a pain pill prior to treatment.   Patient Stated Goals use right arm without pain.   Currently in Pain? Yes   Pain Score 7    Pain Location Shoulder   Pain Orientation Right   Pain Descriptors / Indicators Sore   Pain Type Surgical pain   Pain Onset More than a month ago            [facility] Assessment - 08/31/16 0001      Assessment   Medical Diagnosis Right shoulder SAD/DCR.   Onset Date/Surgical Date 08/11/16   Next MD Visit 09/15/2016     Restrictions   Weight Bearing Restrictions No                     [facility] Adult PT Treatment/Exercise - 08/31/16 0001      Modalities   Modalities Electrical Stimulation;Moist Heat     Moist Heat Therapy   Number Minutes Moist Heat 15 Minutes   Moist Heat Location Shoulder     Electrical Stimulation   Electrical Stimulation Location R shoulder   Electrical Stimulation Action IFC   Electrical Stimulation  Parameters 1-10 hz x15 min   Electrical Stimulation Goals Pain     Manual Therapy   Manual Therapy Passive ROM   Passive ROM Gentle PROM of R shoulder into flexion/ER/IR with gentle holds at end range and oscillations to promote proper mobility                     PT Long Term Goals - 08/21/16 1202      PT LONG TERM GOAL #1   Title Independent with a HEP.   Time 6   Period Weeks   Status New     PT LONG TERM GOAL #2   Title Active right shoulder flexion to 150 degrees so the patient can easily reach overhead   Time 6   Period Weeks   Status New     PT LONG TERM GOAL #3   Title Active ER to 70 degrees+ to allow for easily donning/doffing of apparel   Time 6   Period Weeks   Status New     PT LONG TERM GOAL #4   Title Increase ROM so patient is able to reach behind back to L3.   Time  6   Period Weeks   Status New     PT LONG TERM GOAL #5   Title Increase right shoulder strength to a solid 4+/5 to increase stability for performance of functional activities   Time 6   Period Weeks   Status New     PT LONG TERM GOAL #6   Title Perform ADL's with right shoulder pain not > 3/10.   Time 6   Period Weeks   Status New               Plan - 08/31/16 1158    Clinical Impression Statement Patient tolerated today's treatment much better today with decreased R shoulder pain upon arrival and during PROM. Patient able to tolerate PROM of R shoulder into flexion/ER/IR with capsular tightness increasing as R shoulder flexion approaches 90 deg. Patient's R shoulder ER and IR ROM observed during PROM with good ROM visualized. Frequent oscillations were not required today during treatment. Normal modalities response noted following removal of the modalities. Patient was observed as using RUE to push himself up from treatment table although he reported soreness.   Rehab Potential Excellent   PT Frequency 2x / week   PT Duration 6 weeks   PT Treatment/Interventions  ADLs/Self Care Home Management;Cryotherapy;Electrical Stimulation;Moist Heat;Ultrasound;Patient/family education;Therapeutic exercise;Therapeutic activities;Manual techniques;Passive range of motion;Vasopneumatic Device   PT Next Visit Plan Right shoulder PROM; AAROM.  Modalities PRN.   Consulted and Agree with Plan of Care Patient      Patient will benefit from skilled therapeutic intervention in order to improve the following deficits and impairments:  Pain, Decreased activity tolerance, Decreased range of motion  Visit Diagnosis: Chronic right shoulder pain  Stiffness of right shoulder, not elsewhere classified     Problem List Patient Active Problem List   Diagnosis Date Noted  . GERD (gastroesophageal reflux disease) 01/04/2014  . Chest pain 01/04/2014  . Diabetes mellitus without complication (HCC)   . Hypertension     Evelene Croon, PTA 08/31/2016, 12:07 PM  [facility] [address] [place], [place], [address] Phone: [phone]   Fax:  [phone]  Name: MOUSTAFA MOSSA MRN: [id number] Date of Birth: 28-Jun-1961

## 2016-09-04 ENCOUNTER — Ambulatory Visit: Payer: No Typology Code available for payment source | Admitting: Physical Therapy

## 2016-09-04 DIAGNOSIS — M25611 Stiffness of right shoulder, not elsewhere classified: Secondary | ICD-10-CM

## 2016-09-04 DIAGNOSIS — M25511 Pain in right shoulder: Secondary | ICD-10-CM | POA: Diagnosis not present

## 2016-09-04 DIAGNOSIS — G8929 Other chronic pain: Secondary | ICD-10-CM

## 2016-09-04 NOTE — Therapy (Signed)
[facility] [address] [place], [place], [address] Phone: [phone]   Fax:  [phone]  Physical Therapy Treatment  Patient Details  Name: Renee HarderJeffrey W Lanese MRN: [id number] Date of Birth: 07/29/1960 Referring Provider: Francena HanlyKevin Supple MD.  Encounter Date: 09/04/2016      PT End of Session - 09/04/16 1656    Visit Number 5   Number of Visits 12   Date for PT Re-Evaluation 10/02/16   PT Start Time 0315   PT Stop Time 0402   PT Time Calculation (min) 47 min   Activity Tolerance Patient tolerated treatment well   Behavior During Therapy [facility] for tasks assessed/performed      Past Medical History:  Diagnosis Date  . Anxiety   . Diabetes mellitus without complication (HCC)   . Hypertension     Past Surgical History:  Procedure Laterality Date  . APPENDECTOMY    . CHOLECYSTECTOMY    . COLOSTOMY REVERSAL      There were no vitals filed for this visit.      Subjective Assessment - 09/04/16 1657    Subjective Patient had no new complaint today.   Patient Stated Goals use right arm without pain.   Pain Score 7    Pain Location Shoulder   Pain Orientation Right   Pain Descriptors / Indicators Sore   Pain Type Surgical pain   Pain Onset More than a month ago     Treatment:  PROM to patient's right shoulder in supine x 23 minutes f/b HMP and IFC x 15 minutes.  Patient needs work on regaining right shoulder flexion.                                 PT Long Term Goals - 08/21/16 1202      PT LONG TERM GOAL #1   Title Independent with a HEP.   Time 6   Period Weeks   Status New     PT LONG TERM GOAL #2   Title Active right shoulder flexion to 150 degrees so the patient can easily reach overhead   Time 6   Period Weeks   Status New     PT LONG TERM GOAL #3   Title Active ER to 70 degrees+ to allow for easily donning/doffing of apparel   Time 6   Period Weeks   Status New     PT LONG  TERM GOAL #4   Title Increase ROM so patient is able to reach behind back to L3.   Time 6   Period Weeks   Status New     PT LONG TERM GOAL #5   Title Increase right shoulder strength to a solid 4+/5 to increase stability for performance of functional activities   Time 6   Period Weeks   Status New     PT LONG TERM GOAL #6   Title Perform ADL's with right shoulder pain not > 3/10.   Time 6   Period Weeks   Status New             Patient will benefit from skilled therapeutic intervention in order to improve the following deficits and impairments:  Pain, Decreased activity tolerance, Decreased range of motion  Visit Diagnosis: Chronic right shoulder pain  Stiffness of right shoulder, not elsewhere classified     Problem List Patient Active Problem List   Diagnosis Date Noted  . GERD (  gastroesophageal reflux disease) 01/04/2014  . Chest pain 01/04/2014  . Diabetes mellitus without complication (HCC)   . Hypertension     Whittney Steenson, [place] 09/04/2016, 5:07 PM  [facility] [address] [place], [place], [address] Phone: [phone]   Fax:  [phone]  Name: SISTO GRANILLO MRN: [id number] Date of Birth: 12-15-1960

## 2016-09-07 ENCOUNTER — Ambulatory Visit: Payer: No Typology Code available for payment source | Admitting: *Deleted

## 2016-09-07 DIAGNOSIS — M25511 Pain in right shoulder: Principal | ICD-10-CM

## 2016-09-07 DIAGNOSIS — G8929 Other chronic pain: Secondary | ICD-10-CM

## 2016-09-07 DIAGNOSIS — M25611 Stiffness of right shoulder, not elsewhere classified: Secondary | ICD-10-CM

## 2016-09-07 NOTE — Therapy (Signed)
[facility] [address] [place], [place], [address] Phone: [phone]   Fax:  [phone]  Physical Therapy Treatment  Patient Details  Name: David Holloway MRN: [id number] Date of Birth: Apr 10, 1961 Referring Provider: Francena Hanly MD.  Encounter Date: 09/07/2016      PT End of Session - 09/07/16 1452    Visit Number 6   Number of Visits 12   Date for PT Re-Evaluation 10/02/16   PT Start Time 1345   PT Stop Time 1437   PT Time Calculation (min) 52 min      Past Medical History:  Diagnosis Date  . Anxiety   . Diabetes mellitus without complication (HCC)   . Hypertension     Past Surgical History:  Procedure Laterality Date  . APPENDECTOMY    . CHOLECYSTECTOMY    . COLOSTOMY REVERSAL      There were no vitals filed for this visit.      Subjective Assessment - 09/07/16 1353    Subjective RT shldr has been sore and aching   Patient is accompained by: Family member   Patient Stated Goals use right arm without pain.   Currently in Pain? Yes   Pain Score 7    Pain Location Shoulder   Pain Orientation Right   Pain Descriptors / Indicators Sore;Aching   Pain Type Surgical pain   Pain Onset More than a month ago                         [facility] Adult PT Treatment/Exercise - 09/07/16 0001      Exercises   Exercises Shoulder     Shoulder Exercises: Pulleys   Flexion --  x 5 mins     Modalities   Modalities Electrical Stimulation;Moist Heat     Moist Heat Therapy   Number Minutes Moist Heat 15 Minutes   Moist Heat Location Shoulder     Electrical Stimulation   Electrical Stimulation Location RT shldr IFC x 15 mins 80-150 hz   Electrical Stimulation Goals Pain     Manual Therapy   Manual Therapy Passive ROM   Passive ROM Gentle PROM of R shoulder into flexion/ER/IR with gentle holds at end range and oscillations to promote proper mobility                     PT Long Term  Goals - 08/21/16 1202      PT LONG TERM GOAL #1   Title Independent with a HEP.   Time 6   Period Weeks   Status New     PT LONG TERM GOAL #2   Title Active right shoulder flexion to 150 degrees so the patient can easily reach overhead   Time 6   Period Weeks   Status New     PT LONG TERM GOAL #3   Title Active ER to 70 degrees+ to allow for easily donning/doffing of apparel   Time 6   Period Weeks   Status New     PT LONG TERM GOAL #4   Title Increase ROM so patient is able to reach behind back to L3.   Time 6   Period Weeks   Status New     PT LONG TERM GOAL #5   Title Increase right shoulder strength to a solid 4+/5 to increase stability for performance of functional activities   Time 6   Period Weeks   Status New  PT LONG TERM GOAL #6   Title Perform ADL's with right shoulder pain not > 3/10.   Time 6   Period Weeks   Status New               Plan - 09/07/16 1453    Clinical Impression Statement Pt did fairlywell today, but continues to have pain in RT ACJ/ and sldr 7-8/10. PROM today flexion to 120 and ER to 60 degrees. LTGs are ongoing   Rehab Potential Excellent   PT Frequency 2x / week   PT Duration 6 weeks   PT Treatment/Interventions ADLs/Self Care Home Management;Cryotherapy;Electrical Stimulation;Moist Heat;Ultrasound;Patient/family education;Therapeutic exercise;Therapeutic activities;Manual techniques;Passive range of motion;Vasopneumatic Device   PT Next Visit Plan Right shoulder PROM; AAROM.  Modalities PRN.   Consulted and Agree with Plan of Care Patient      Patient will benefit from skilled therapeutic intervention in order to improve the following deficits and impairments:  Pain, Decreased activity tolerance, Decreased range of motion  Visit Diagnosis: Chronic right shoulder pain  Stiffness of right shoulder, not elsewhere classified     Problem List Patient Active Problem List   Diagnosis Date Noted  . GERD  (gastroesophageal reflux disease) 01/04/2014  . Chest pain 01/04/2014  . Diabetes mellitus without complication (HCC)   . Hypertension     Oktober Glazer,CHRIS, PTA 09/07/2016, 2:58 PM  [facility] [address] [place], [place], [address] Phone: [phone]   Fax:  [phone]  Name: David Holloway MRN: [id number] Date of Birth: 04/23/1961

## 2016-09-11 ENCOUNTER — Ambulatory Visit: Payer: No Typology Code available for payment source | Admitting: Physical Therapy

## 2016-09-11 ENCOUNTER — Encounter: Payer: Self-pay | Admitting: Physical Therapy

## 2016-09-11 DIAGNOSIS — G8929 Other chronic pain: Secondary | ICD-10-CM

## 2016-09-11 DIAGNOSIS — M25511 Pain in right shoulder: Secondary | ICD-10-CM | POA: Diagnosis not present

## 2016-09-11 DIAGNOSIS — M25611 Stiffness of right shoulder, not elsewhere classified: Secondary | ICD-10-CM

## 2016-09-11 NOTE — Therapy (Signed)
[facility] [address] [place], [place], [address] Phone: [phone]   Fax:  [phone]  Physical Therapy Treatment  Patient Details  Name: David Holloway MRN: [id number] Date of Birth: 12/08/1960 Referring Provider: Francena HanlyKevin Supple MD.  Encounter Date: 09/11/2016      PT End of Session - 09/11/16 1143    Visit Number 7   Number of Visits 12   Date for PT Re-Evaluation 10/02/16   PT Start Time 1114   PT Stop Time 1155   PT Time Calculation (min) 41 min   Activity Tolerance Patient tolerated treatment well;Patient limited by pain   Behavior During Therapy [facility] for tasks assessed/performed      Past Medical History:  Diagnosis Date  . Anxiety   . Diabetes mellitus without complication (HCC)   . Hypertension     Past Surgical History:  Procedure Laterality Date  . APPENDECTOMY    . CHOLECYSTECTOMY    . COLOSTOMY REVERSAL      There were no vitals filed for this visit.      Subjective Assessment - 09/11/16 1120    Subjective patient reported ongoing pain   Patient is accompained by: Family member   Patient Stated Goals use right arm without pain.   Currently in Pain? Yes   Pain Score 8    Pain Location Shoulder   Pain Orientation Right   Pain Descriptors / Indicators Aching;Sore   Pain Type Surgical pain   Pain Onset More than a month ago   Pain Frequency Constant   Aggravating Factors  movement   Pain Relieving Factors rest and meds            [facility] PT Assessment - 09/11/16 0001      ROM / Strength   AROM / PROM / Strength AROM;PROM     AROM   AROM Assessment Site Shoulder   Right/Left Shoulder Right   Right Shoulder External Rotation 27 Degrees     PROM   PROM Assessment Site Shoulder   Right/Left Shoulder Right   Right Shoulder Flexion 114 Degrees   Right Shoulder External Rotation 45 Degrees                     [facility] Adult PT Treatment/Exercise - 09/11/16 0001      Moist  Heat Therapy   Number Minutes Moist Heat 15 Minutes   Moist Heat Location Shoulder     Electrical Stimulation   Electrical Stimulation Location RT shldr IFC x 15 mins 80-150 hz   Electrical Stimulation Goals Pain     Manual Therapy   Manual Therapy Passive ROM   Passive ROM Gentle PROM of R shoulder into flexion/ER/IR with gentle holds at end range and oscillations to promote proper mobility                     PT Long Term Goals - 08/21/16 1202      PT LONG TERM GOAL #1   Title Independent with a HEP.   Time 6   Period Weeks   Status New     PT LONG TERM GOAL #2   Title Active right shoulder flexion to 150 degrees so the patient can easily reach overhead   Time 6   Period Weeks   Status New     PT LONG TERM GOAL #3   Title Active ER to 70 degrees+ to allow for easily donning/doffing of apparel   Time 6  Period Weeks   Status New     PT LONG TERM GOAL #4   Title Increase ROM so patient is able to reach behind back to L3.   Time 6   Period Weeks   Status New     PT LONG TERM GOAL #5   Title Increase right shoulder strength to a solid 4+/5 to increase stability for performance of functional activities   Time 6   Period Weeks   Status New     PT LONG TERM GOAL #6   Title Perform ADL's with right shoulder pain not > 3/10.   Time 6   Period Weeks   Status New               Plan - 09/11/16 1144    Clinical Impression Statement Patient tolerated treatment fairly well with ongoing pain complaints in right shoulder/ACJ. Patient unable to tolerate exercices today due to pain level. Patient had increased guarding with manual ROM and required verbal cues and ossilations to decrease muscle guarding and pain. Patient did improve ROM for flexion and ER today. Current goals ongoing due to pain and ROM deficts.    Rehab Potential Excellent   PT Frequency 2x / week   PT Duration 6 weeks   PT Treatment/Interventions ADLs/Self Care Home  Management;Cryotherapy;Electrical Stimulation;Moist Heat;Ultrasound;Patient/family education;Therapeutic exercise;Therapeutic activities;Manual techniques;Passive range of motion;Vasopneumatic Device   PT Next Visit Plan Right shoulder PROM; AAROM.  Modalities PRN. (MD. Rennis Chris 09/15/16) MD note next visit   Consulted and Agree with Plan of Care Patient      Patient will benefit from skilled therapeutic intervention in order to improve the following deficits and impairments:  Pain, Decreased activity tolerance, Decreased range of motion  Visit Diagnosis: Chronic right shoulder pain  Stiffness of right shoulder, not elsewhere classified     Problem List Patient Active Problem List   Diagnosis Date Noted  . GERD (gastroesophageal reflux disease) 01/04/2014  . Chest pain 01/04/2014  . Diabetes mellitus without complication (HCC)   . Hypertension     David Holloway, PTA 09/11/2016, 11:58 AM  [facility] [address] [place], [place], [address] Phone: [phone]   Fax:  [phone]  Name: David Holloway MRN: [id number] Date of Birth: Jul 24, 1961

## 2016-09-13 ENCOUNTER — Ambulatory Visit: Payer: No Typology Code available for payment source | Admitting: Physical Therapy

## 2016-09-13 DIAGNOSIS — M25611 Stiffness of right shoulder, not elsewhere classified: Secondary | ICD-10-CM

## 2016-09-13 DIAGNOSIS — G8929 Other chronic pain: Secondary | ICD-10-CM

## 2016-09-13 DIAGNOSIS — M25511 Pain in right shoulder: Principal | ICD-10-CM

## 2016-09-13 NOTE — Therapy (Signed)
[facility] [address] [place], [place], [address] Phone: [phone]   Fax:  [phone]  Physical Therapy Treatment  Patient Details  Name: David Holloway MRN: [id number] Date of Birth: 01-06-1961 Referring Provider: Francena Hanly MD.  Encounter Date: 09/13/2016      PT End of Session - 09/13/16 1750    Visit Number 8   Number of Visits 12   Date for PT Re-Evaluation 10/02/16   PT Start Time 0152   PT Stop Time 0239   PT Time Calculation (min) 47 min   Activity Tolerance Patient tolerated treatment well;Patient limited by pain   Behavior During Therapy [facility] for tasks assessed/performed      Past Medical History:  Diagnosis Date  . Anxiety   . Diabetes mellitus without complication (HCC)   . Hypertension     Past Surgical History:  Procedure Laterality Date  . APPENDECTOMY    . CHOLECYSTECTOMY    . COLOSTOMY REVERSAL      There were no vitals filed for this visit.      Subjective Assessment - 09/13/16 1751    Subjective I just want my shoulder to stop hurting.   Patient Stated Goals use right arm without pain.   Pain Score 8    Pain Location Shoulder   Pain Orientation Right   Pain Descriptors / Indicators Aching;Sore   Pain Type Surgical pain   Pain Onset More than a month ago    Treatment:  Pulleys x 5 minutes f/b UE Ranger x 5 minutes f/b wall ladder x 5 minutes f/b PROM in supine to patient's right shoulder into flexion and ER (in plane of scapula) x 8 minutes f/b HMP and IFC x 15 minutes.  Patient tolerated tx. well.                                  PT Long Term Goals - 08/21/16 1202      PT LONG TERM GOAL #1   Title Independent with a HEP.   Time 6   Period Weeks   Status New     PT LONG TERM GOAL #2   Title Active right shoulder flexion to 150 degrees so the patient can easily reach overhead   Time 6   Period Weeks   Status New     PT LONG TERM GOAL #3   Title  Active ER to 70 degrees+ to allow for easily donning/doffing of apparel   Time 6   Period Weeks   Status New     PT LONG TERM GOAL #4   Title Increase ROM so patient is able to reach behind back to L3.   Time 6   Period Weeks   Status New     PT LONG TERM GOAL #5   Title Increase right shoulder strength to a solid 4+/5 to increase stability for performance of functional activities   Time 6   Period Weeks   Status New     PT LONG TERM GOAL #6   Title Perform ADL's with right shoulder pain not > 3/10.   Time 6   Period Weeks   Status New             Patient will benefit from skilled therapeutic intervention in order to improve the following deficits and impairments:  Pain, Decreased activity tolerance, Decreased range of motion  Visit Diagnosis: Chronic right shoulder pain  Stiffness of right shoulder, not elsewhere classified     Problem List Patient Active Problem List   Diagnosis Date Noted  . GERD (gastroesophageal reflux disease) 01/04/2014  . Chest pain 01/04/2014  . Diabetes mellitus without complication (HCC)   . Hypertension     Bernise Sylvain, [place] MPT 09/13/2016, 5:56 PM  [facility] [address] [place], [place], [address] Phone: [phone]   Fax:  [phone]  Name: David Holloway MRN: [id number] Date of Birth: 02/23/1961

## 2016-09-19 ENCOUNTER — Ambulatory Visit: Payer: No Typology Code available for payment source | Admitting: Physical Therapy

## 2016-09-19 ENCOUNTER — Encounter: Payer: Self-pay | Admitting: Physical Therapy

## 2016-09-19 DIAGNOSIS — G8929 Other chronic pain: Secondary | ICD-10-CM

## 2016-09-19 DIAGNOSIS — M25511 Pain in right shoulder: Secondary | ICD-10-CM | POA: Diagnosis not present

## 2016-09-19 DIAGNOSIS — M25611 Stiffness of right shoulder, not elsewhere classified: Secondary | ICD-10-CM

## 2016-09-19 NOTE — Therapy (Signed)
[facility] [address] [place], [place], [address] Phone: [phone]   Fax:  [phone]  Physical Therapy Treatment  Patient Details  Name: David Holloway MRN: [id number] Date of Birth: [birth year] Referring Provider: Francena Hanly MD.  Encounter Date: 09/19/2016      PT End of Session - 09/19/16 1154    Visit Number 9   Number of Visits 12   Date for PT Re-Evaluation 10/02/16   PT Start Time 1120   PT Stop Time 1204   PT Time Calculation (min) 44 min   Activity Tolerance Patient limited by pain   Behavior During Therapy [facility] for tasks assessed/performed      Past Medical History:  Diagnosis Date  . Anxiety   . Diabetes mellitus without complication (HCC)   . Hypertension     Past Surgical History:  Procedure Laterality Date  . APPENDECTOMY    . CHOLECYSTECTOMY    . COLOSTOMY REVERSAL      There were no vitals filed for this visit.      Subjective Assessment - 09/19/16 1200    Subjective Patient arrived to treatment with reports of increased R shoulder pain for unknown reason.   Patient Stated Goals use right arm without pain.   Currently in Pain? Yes   Pain Score --  "bad" but no numerical rating   Pain Location Shoulder   Pain Orientation Right   Pain Descriptors / Indicators Discomfort   Pain Type Surgical pain   Pain Onset More than a month ago            [facility] PT Assessment - 09/19/16 0001      Assessment   Medical Diagnosis Right shoulder SAD/DCR.   Onset Date/Surgical Date 08/11/16   Next MD Visit 09/15/2016     Restrictions   Weight Bearing Restrictions No     ROM / Strength   AROM / PROM / Strength PROM     PROM   Overall PROM  Deficits   PROM Assessment Site Shoulder   Right/Left Shoulder Right   Right Shoulder Flexion 90 Degrees   Right Shoulder Internal Rotation 54 Degrees   Right Shoulder External Rotation 43 Degrees                     [facility] Adult PT  Treatment/Exercise - 09/19/16 0001      Modalities   Modalities Electrical Stimulation;Moist Heat     Moist Heat Therapy   Number Minutes Moist Heat 15 Minutes   Moist Heat Location Shoulder     Electrical Stimulation   Electrical Stimulation Location R shoulder   Electrical Stimulation Action IFC   Electrical Stimulation Parameters 1-10 hz x15 min   Electrical Stimulation Goals Pain;Tone     Manual Therapy   Manual Therapy Passive ROM;Soft tissue mobilization   Soft tissue mobilization STW/M to R Pectorals to reduce tone and pain    Passive ROM Gentle PROM of R shoulder into flexion/ER/IR with gentle holds at end range and oscillations to promote proper mobility                     PT Long Term Goals - 08/21/16 1202      PT LONG TERM GOAL #1   Title Independent with a HEP.   Time 6   Period Weeks   Status New     PT LONG TERM GOAL #2   Title Active right shoulder flexion to 150 degrees  so the patient can easily reach overhead   Time 6   Period Weeks   Status New     PT LONG TERM GOAL #3   Title Active ER to 70 degrees+ to allow for easily donning/doffing of apparel   Time 6   Period Weeks   Status New     PT LONG TERM GOAL #4   Title Increase ROM so patient is able to reach behind back to L3.   Time 6   Period Weeks   Status New     PT LONG TERM GOAL #5   Title Increase right shoulder strength to a solid 4+/5 to increase stability for performance of functional activities   Time 6   Period Weeks   Status New     PT LONG TERM GOAL #6   Title Perform ADL's with right shoulder pain not > 3/10.   Time 6   Period Weeks   Status New               Plan - 09/19/16 1201    Clinical Impression Statement Patient arrived to clinic with reports of "bad" R shoulder pain and inquired of what could possibly be causing increased pain at this time. Patient taken through gentle PROM of R shoulder into flexion/ER/IR with frequent oscilations used and  facial grimacing noted as well. Tightness palpated throughout patient's R lateral Pectorals region that released minimally with STW. PROM of R shoulder measured in supine today as 90 deg flexion, 43 deg ER. 54 deg IR. Normal modalities response noted following removal of the modalities.   Rehab Potential Excellent   PT Frequency 2x / week   PT Duration 6 weeks   PT Treatment/Interventions ADLs/Self Care Home Management;Cryotherapy;Electrical Stimulation;Moist Heat;Ultrasound;Patient/family education;Therapeutic exercise;Therapeutic activities;Manual techniques;Passive range of motion;Vasopneumatic Device   PT Next Visit Plan Right shoulder PROM; AAROM.  Modalities PRN. (MD. Rennis ChrisSupple 09/15/16) MD note next visit   Consulted and Agree with Plan of Care Patient      Patient will benefit from skilled therapeutic intervention in order to improve the following deficits and impairments:  Pain, Decreased activity tolerance, Decreased range of motion  Visit Diagnosis: Chronic right shoulder pain  Stiffness of right shoulder, not elsewhere classified     Problem List Patient Active Problem List   Diagnosis Date Noted  . GERD (gastroesophageal reflux disease) 01/04/2014  . Chest pain 01/04/2014  . Diabetes mellitus without complication (HCC)   . Hypertension     Florence CannerKelsey Parsons, PTA 09/19/16 4:16 PM [place] MPT [facility] [address] [place], [place], [id number] Phone: [phone]   Fax:  [phone]  Name: David Holloway MRN: [id number] Date of Birth: 10/19/1960

## 2016-09-21 ENCOUNTER — Ambulatory Visit: Payer: Medicare PPO | Attending: Orthopedic Surgery | Admitting: Physical Therapy

## 2016-09-21 DIAGNOSIS — M25611 Stiffness of right shoulder, not elsewhere classified: Secondary | ICD-10-CM | POA: Diagnosis present

## 2016-09-21 DIAGNOSIS — M25511 Pain in right shoulder: Secondary | ICD-10-CM | POA: Diagnosis present

## 2016-09-21 DIAGNOSIS — G8929 Other chronic pain: Secondary | ICD-10-CM | POA: Insufficient documentation

## 2016-09-21 NOTE — Therapy (Signed)
[facility] [address] [place], [place], [address] Phone: [phone]   Fax:  [phone]  Physical Therapy Treatment  Patient Details  Name: David Holloway MRN: [id number] Date of Birth: 10-Apr-1961 Referring Provider: Francena Hanly MD.  Encounter Date: 09/21/2016      PT End of Session - 09/21/16 1256    PT Start Time 1119   PT Stop Time 1210   PT Time Calculation (min) 51 min   Activity Tolerance Patient limited by pain   Behavior During Therapy [facility] for tasks assessed/performed      Past Medical History:  Diagnosis Date  . Anxiety   . Diabetes mellitus without complication (HCC)   . Hypertension     Past Surgical History:  Procedure Laterality Date  . APPENDECTOMY    . CHOLECYSTECTOMY    . COLOSTOMY REVERSAL      There were no vitals filed for this visit.      Subjective Assessment - 09/21/16 1251    Subjective My shoulder is hurting a lot today.  The doctor said it will just take time.  New order for "progressive stretching."   Pain Score 7    Pain Location Shoulder   Pain Orientation Right   Pain Descriptors / Indicators Discomfort   Pain Onset More than a month ago                         [facility] Adult PT Treatment/Exercise - 09/21/16 0001      Shoulder Exercises: Supine   Other Supine Exercises LLLDS in plane of scapula to ER with yellow theraband x 8 minutes.     Shoulder Exercises: Pulleys   Flexion Limitations 6 minutes.     Modalities   Modalities Electrical Stimulation;Ultrasound;Vasopneumatic     Moist Heat Therapy   Number Minutes Moist Heat 15 Minutes   Moist Heat Location --  RT SHLD.     Programme researcher, broadcasting/film/video Location --  RT SHLD.   Electrical Stimulation Action IFC   Electrical Stimulation Parameters 80-150 Hz x 15 minutes.   Electrical Stimulation Goals Tone;Pain     Ultrasound   Ultrasound Location Combo e'stim/U/S at 1.50 W/CM2 x 12  minutes to patient's right shoulder while maintaining a manual stretch into ER x 12 minutes.                     PT Long Term Goals - 08/21/16 1202      PT LONG TERM GOAL #1   Title Independent with a HEP.   Time 6   Period Weeks   Status New     PT LONG TERM GOAL #2   Title Active right shoulder flexion to 150 degrees so the patient can easily reach overhead   Time 6   Period Weeks   Status New     PT LONG TERM GOAL #3   Title Active ER to 70 degrees+ to allow for easily donning/doffing of apparel   Time 6   Period Weeks   Status New     PT LONG TERM GOAL #4   Title Increase ROM so patient is able to reach behind back to L3.   Time 6   Period Weeks   Status New     PT LONG TERM GOAL #5   Title Increase right shoulder strength to a solid 4+/5 to increase stability for performance of functional activities   Time 6  Period Weeks   Status New     PT LONG TERM GOAL #6   Title Perform ADL's with right shoulder pain not > 3/10.   Time 6   Period Weeks   Status New             Patient will benefit from skilled therapeutic intervention in order to improve the following deficits and impairments:  Pain, Decreased activity tolerance, Decreased range of motion  Visit Diagnosis: Chronic right shoulder pain  Stiffness of right shoulder, not elsewhere classified     Problem List Patient Active Problem List   Diagnosis Date Noted  . GERD (gastroesophageal reflux disease) 01/04/2014  . Chest pain 01/04/2014  . Diabetes mellitus without complication (HCC)   . Hypertension     Vaughan Garfinkle, [place] MPT 09/21/2016, 12:58 PM  [facility] [address] [place], [place], [id number] Phone: [phone]   Fax:  [phone]  Name: David Holloway MRN: [id number] Date of Birth: 09/25/1960

## 2016-09-26 ENCOUNTER — Ambulatory Visit: Payer: Medicare PPO | Admitting: *Deleted

## 2016-09-26 DIAGNOSIS — M25611 Stiffness of right shoulder, not elsewhere classified: Secondary | ICD-10-CM

## 2016-09-26 DIAGNOSIS — G8929 Other chronic pain: Secondary | ICD-10-CM

## 2016-09-26 DIAGNOSIS — M25511 Pain in right shoulder: Secondary | ICD-10-CM | POA: Diagnosis not present

## 2016-09-26 NOTE — Therapy (Signed)
[facility] [address] [place], [place], [address] Phone: [phone]   Fax:  [phone]  Physical Therapy Treatment  Patient Details  Name: MANJOT HINKS MRN: [id number] Date of Birth: 03-18-1961 Referring Provider: Francena Hanly MD.  Encounter Date: 09/26/2016      PT End of Session - 09/26/16 1155    Visit Number 10   Number of Visits 12   Date for PT Re-Evaluation 10/02/16   PT Start Time 1115   PT Stop Time 1205   PT Time Calculation (min) 50 min      Past Medical History:  Diagnosis Date  . Anxiety   . Diabetes mellitus without complication (HCC)   . Hypertension     Past Surgical History:  Procedure Laterality Date  . APPENDECTOMY    . CHOLECYSTECTOMY    . COLOSTOMY REVERSAL      There were no vitals filed for this visit.      Subjective Assessment - 09/26/16 1126    Subjective My shoulder is hurting a lot today.  The doctor said it will just take time.  New order for "progressive stretching."   Patient is accompained by: Family member   Patient Stated Goals use right arm without pain.   Currently in Pain? Yes   Pain Score 8    Pain Location Shoulder   Pain Orientation Right   Pain Descriptors / Indicators Discomfort   Pain Type Surgical pain   Pain Onset More than a month ago                         [facility] Adult PT Treatment/Exercise - 09/26/16 0001      Shoulder Exercises: Supine   Other Supine Exercises cane flexion x 10 to 120 degrees     Shoulder Exercises: Pulleys   Flexion Limitations 6 minutes.   Other Pulley Exercises Sitting UE ranger flexion and circles each way x 5 mins     Modalities   Modalities Electrical Stimulation;Ultrasound;Vasopneumatic     Moist Heat Therapy   Number Minutes Moist Heat 15 Minutes   Moist Heat Location --  RT SHLD.     Programme researcher, broadcasting/film/video Location RT shldr IFC x 15 mins 80-150 hz   Electrical Stimulation  Goals Pain     Manual Therapy   Manual Therapy Passive ROM;Soft tissue mobilization   Passive ROM  PROM of R shoulder into flexion/ER/IR with holds at end range and oscillations to promote proper mobility                     PT Long Term Goals - 08/21/16 1202      PT LONG TERM GOAL #1   Title Independent with a HEP.   Time 6   Period Weeks   Status New     PT LONG TERM GOAL #2   Title Active right shoulder flexion to 150 degrees so the patient can easily reach overhead   Time 6   Period Weeks   Status New     PT LONG TERM GOAL #3   Title Active ER to 70 degrees+ to allow for easily donning/doffing of apparel   Time 6   Period Weeks   Status New     PT LONG TERM GOAL #4   Title Increase ROM so patient is able to reach behind back to L3.   Time 6   Period Weeks   Status  New     PT LONG TERM GOAL #5   Title Increase right shoulder strength to a solid 4+/5 to increase stability for performance of functional activities   Time 6   Period Weeks   Status New     PT LONG TERM GOAL #6   Title Perform ADL's with right shoulder pain not > 3/10.   Time 6   Period Weeks   Status New               Plan - 09/26/16 1252    Clinical Impression Statement Pt arrived today to clinic with reports of high pain levels still 8/10. He was able to perform AAROM Exs, but slow and cautious due to pain. During PROM he was also gaurded, but was able to relax and reeach improved ROM for flexion to 120 degrees and ER /IR to 60 degrees.   Rehab Potential Excellent   PT Frequency 2x / week   PT Duration 6 weeks   PT Treatment/Interventions ADLs/Self Care Home Management;Cryotherapy;Electrical Stimulation;Moist Heat;Ultrasound;Patient/family education;Therapeutic exercise;Therapeutic activities;Manual techniques;Passive range of motion;Vasopneumatic Device   PT Next Visit Plan Right shoulder PROM; AAROM.  Modalities PRN. (MD. Rennis ChrisSupple 09/15/16)    Consulted and Agree with Plan of  Care Patient      Patient will benefit from skilled therapeutic intervention in order to improve the following deficits and impairments:  Pain, Decreased activity tolerance, Decreased range of motion  Visit Diagnosis: Chronic right shoulder pain  Stiffness of right shoulder, not elsewhere classified     Problem List Patient Active Problem List   Diagnosis Date Noted  . GERD (gastroesophageal reflux disease) 01/04/2014  . Chest pain 01/04/2014  . Diabetes mellitus without complication (HCC)   . Hypertension     Hartwell Vandiver,CHRIS, PTA 09/26/2016, 12:59 PM  [facility] [address] [place], [place], [id number] Phone: [phone]   Fax:  [phone]  Name: Renee HarderJeffrey W Castronova MRN: [id number] Date of Birth: 10/16/1960

## 2016-09-28 ENCOUNTER — Ambulatory Visit: Payer: Medicare PPO | Admitting: *Deleted

## 2016-09-28 DIAGNOSIS — M25511 Pain in right shoulder: Secondary | ICD-10-CM | POA: Diagnosis not present

## 2016-09-28 DIAGNOSIS — M25611 Stiffness of right shoulder, not elsewhere classified: Secondary | ICD-10-CM

## 2016-09-28 DIAGNOSIS — G8929 Other chronic pain: Secondary | ICD-10-CM

## 2016-09-28 NOTE — Therapy (Signed)
[facility] [address] [place], [place], [address] Phone: [phone]   Fax:  [phone]  Physical Therapy Treatment  Patient Details  Name: David Holloway MRN: [id number] Date of Birth: 04-Apr-1961 Referring Provider: Francena Hanly MD.  Encounter Date: 09/28/2016      PT End of Session - 09/28/16 1211    Visit Number 11   Number of Visits 12   Date for PT Re-Evaluation 10/02/16   PT Start Time 1115   PT Stop Time 1205   PT Time Calculation (min) 50 min      Past Medical History:  Diagnosis Date  . Anxiety   . Diabetes mellitus without complication (HCC)   . Hypertension     Past Surgical History:  Procedure Laterality Date  . APPENDECTOMY    . CHOLECYSTECTOMY    . COLOSTOMY REVERSAL      There were no vitals filed for this visit.      Subjective Assessment - 09/28/16 1122    Subjective My shoulder is hurting a lot today.  The doctor said it will just take time.  New order for "progressive stretching."   Patient is accompained by: Family member   Patient Stated Goals use right arm without pain.   Currently in Pain? Yes   Pain Score 8    Pain Orientation Right   Pain Descriptors / Indicators Discomfort   Pain Type Surgical pain   Pain Onset More than a month ago                         [facility] Adult PT Treatment/Exercise - 09/28/16 0001      Shoulder Exercises: Pulleys   Flexion Limitations 6 minutes.   Other Pulley Exercises Sitting UE ranger flexion and circles each way x 5 mins     Modalities   Modalities Electrical Stimulation;Ultrasound;Vasopneumatic     Moist Heat Therapy   Number Minutes Moist Heat 15 Minutes   Moist Heat Location Shoulder     Electrical Stimulation   Electrical Stimulation Location RT shldr IFC x 15 mins 80-150 hz   Electrical Stimulation Goals Pain     Manual Therapy   Manual Therapy Passive ROM;Soft tissue mobilization   Passive ROM  PROM of R shoulder into  flexion to 120 degrees /ER/IR with holds at end range and oscillations to promote proper mobility                     PT Long Term Goals - 08/21/16 1202      PT LONG TERM GOAL #1   Title Independent with a HEP.   Time 6   Period Weeks   Status New     PT LONG TERM GOAL #2   Title Active right shoulder flexion to 150 degrees so the patient can easily reach overhead   Time 6   Period Weeks   Status New     PT LONG TERM GOAL #3   Title Active ER to 70 degrees+ to allow for easily donning/doffing of apparel   Time 6   Period Weeks   Status New     PT LONG TERM GOAL #4   Title Increase ROM so patient is able to reach behind back to L3.   Time 6   Period Weeks   Status New     PT LONG TERM GOAL #5   Title Increase right shoulder strength to a solid 4+/5 to increase  stability for performance of functional activities   Time 6   Period Weeks   Status New     PT LONG TERM GOAL #6   Title Perform ADL's with right shoulder pain not > 3/10.   Time 6   Period Weeks   Status New               Plan - 09/28/16 1214    Clinical Impression Statement Pt arrived to clinic with RT shldr doing about the same as per PT with pain 8/10. He did a little better with PROM today after performing contract Relax he was able to reach 125 degrees flexion, ER to65 and IR to 70 degrees. LTGs are ongoing due to deficits.   Rehab Potential Excellent   PT Frequency 2x / week   PT Duration 6 weeks   PT Treatment/Interventions ADLs/Self Care Home Management;Cryotherapy;Electrical Stimulation;Moist Heat;Ultrasound;Patient/family education;Therapeutic exercise;Therapeutic activities;Manual techniques;Passive range of motion;Vasopneumatic Device   PT Next Visit Plan Right shoulder PROM; AAROM.  Modalities PRN. (MD. Rennis ChrisSupple 09/15/16)       Patient will benefit from skilled therapeutic intervention in order to improve the following deficits and impairments:  Pain, Decreased activity  tolerance, Decreased range of motion  Visit Diagnosis: Chronic right shoulder pain  Stiffness of right shoulder, not elsewhere classified     Problem List Patient Active Problem List   Diagnosis Date Noted  . GERD (gastroesophageal reflux disease) 01/04/2014  . Chest pain 01/04/2014  . Diabetes mellitus without complication (HCC)   . Hypertension     Vonetta Foulk,CHRIS, PTA 09/28/2016, 12:17 PM  [facility] [address] [place], [place], [address] Phone: [phone]   Fax:  [phone]  Name: David HarderJeffrey W Holloway MRN: [id number] Date of Birth: 09/06/1960

## 2016-10-03 ENCOUNTER — Ambulatory Visit: Payer: Medicare PPO | Admitting: Physical Therapy

## 2016-10-03 DIAGNOSIS — G8929 Other chronic pain: Secondary | ICD-10-CM

## 2016-10-03 DIAGNOSIS — M25511 Pain in right shoulder: Secondary | ICD-10-CM | POA: Diagnosis not present

## 2016-10-03 DIAGNOSIS — M25611 Stiffness of right shoulder, not elsewhere classified: Secondary | ICD-10-CM

## 2016-10-03 NOTE — Therapy (Signed)
[facility] [place], [place], [address] Phone: [phone]   Fax:  [phone]  Physical Therapy Treatment Recertification/Progress Note  Patient Details  Name: David Holloway MRN: [id number] Date of Birth: 02/26/1961 Referring Provider: Justice Britain MD.  Encounter Date: 10/03/2016      PT End of Session - 10/03/16 1419    Visit Number 12   Number of Visits 24   Date for PT Re-Evaluation 11/14/16   Authorization Type Sumitted Recert for 12 additional visits on 10/03/16   PT Start Time 1230   PT Stop Time 1315   PT Time Calculation (min) 45 min   Activity Tolerance Patient tolerated treatment well;Patient limited by pain   Behavior During Therapy [facility] for tasks assessed/performed      Past Medical History:  Diagnosis Date  . Anxiety   . Diabetes mellitus without complication ([place])   . Hypertension     Past Surgical History:  Procedure Laterality Date  . APPENDECTOMY    . CHOLECYSTECTOMY    . COLOSTOMY REVERSAL      There were no vitals filed for this visit.      Subjective Assessment - 10/03/16 1416    Subjective Pt arriving to therapy today reporting pain in R UE of 8/10. Pt reporting doing his exercises at home and icing intermittenly.    Patient Stated Goals use right arm without pain.   Currently in Pain? Yes   Pain Score 8    Pain Location Shoulder   Pain Orientation Right   Pain Descriptors / Indicators Sore;Tightness;Discomfort   Pain Type Surgical pain   Pain Onset More than a month ago   Pain Frequency Constant   Aggravating Factors  movement   Pain Relieving Factors rest, ice, meds   Effect of Pain on Daily Activities unable to perform household duties and ADL's. Pt reporting diffculty sleeping   Multiple Pain Sites No            [facility] PT Assessment - 10/03/16 0001      Assessment   Medical Diagnosis Right shoulder SAD/DCR.   Onset Date/Surgical Date 08/11/16   Next MD Visit  10/18/16     Restrictions   Weight Bearing Restrictions No     ROM / Strength   AROM / PROM / Strength Strength     AROM   Right/Left Shoulder --     PROM   PROM Assessment Site Shoulder   Right/Left Shoulder Right   Right Shoulder Flexion 110 Degrees   Right Shoulder Internal Rotation --  to pt's stomach   Right Shoulder External Rotation 60 Degrees   Right Shoulder Horizontal ABduction 90 Degrees     Strength   Strength Assessment Site Shoulder   Right/Left Shoulder Right   Right Shoulder Flexion 3/5   Right Shoulder ABduction 3/5   Right Shoulder Internal Rotation --  limited by pain   Right Shoulder External Rotation --  limited by pain     Palpation   Palpation comment Tenderness to palpation over anterior deltoid, R Upper Trap and R bicep insertion.                      [facility] Adult PT Treatment/Exercise - 10/03/16 0001      Shoulder Exercises: Pulleys   Flexion Limitations 8 minutes   Other Pulley Exercises Standing UE ranger 4 minutes     Modalities   Modalities Electrical Stimulation;Ultrasound;Vasopneumatic     Moist Heat Therapy  Number Minutes Moist Heat 15 Minutes   Moist Heat Location Shoulder     Electrical Stimulation   Electrical Stimulation Location R shoulder   Electrical Stimulation Action IFC   Electrical Stimulation Parameters 80-150 Hz x 15 minutes   Electrical Stimulation Goals Pain     Manual Therapy   Manual Therapy Passive ROM;Soft tissue mobilization   Passive ROM  PROM of R shoulder into flexion/ER/IR with holds at end range and oscillations to promote proper mobility                PT Education - 10/03/16 1418    Education Details Importance of continued movement and icing for inflammation.   Person(s) Educated Patient   Methods Explanation   Comprehension Verbalized understanding             PT Long Term Goals - 10/03/16 1420      PT LONG TERM GOAL #1   Title Independent with a HEP.   Time 6    Period Weeks   Status Achieved     PT LONG TERM GOAL #2   Title Active right shoulder flexion to 150 degrees so the patient can easily reach overhead   Baseline Passive ROM R shoulder was 110 degrees today limited by pain 10/03/16   Time 6   Period Weeks   Status Not Met     PT LONG TERM GOAL #3   Title Active ER to 70 degrees+ to allow for easily donning/doffing of apparel   Baseline passive ER of R shoulder 60 degrees 10/03/16   Time 6   Period Weeks   Status Not Met     PT LONG TERM GOAL #4   Title Increase ROM so patient is able to reach behind back to L3.   Status Unable to assess     PT LONG TERM GOAL #5   Title Increase right shoulder strength to a solid 4+/5 to increase stability for performance of functional activities   Baseline 3/5 R shoulder strength grossly 10/03/16   Time 6   Period Weeks   Status Not Met     PT LONG TERM GOAL #6   Title Perform ADL's with right shoulder pain not > 3/10.   Baseline Patient still reporting daily pain of >/= 6/10 with ADL's 10/03/16   Time 6   Period Weeks   Status Not Met               Plan - 10/03/16 1312    Clinical Impression Statement Patient arriving to therapy clinic with no sling today. Pt reportring 8/10 pain. today and difficutly sleeping. Pt with limited shoulder flexion today due to increased pain. Pt tolerated treatment well. Discussed dry needling for pt's upper trap, bicep, and anterior deltoid. Patient has only met one of his LTG's set. Pt's progress is limited by pain and reporting he has an appointment with his MD on 10/18/16 for a possible shoulder injection. We will continue skilled PT to porgress toward pt's goals and increased functional mobility.    Rehab Potential Excellent   PT Frequency 2x / week   PT Duration 6 weeks   PT Treatment/Interventions ADLs/Self Care Home Management;Cryotherapy;Electrical Stimulation;Moist Heat;Ultrasound;Patient/family education;Therapeutic exercise;Therapeutic  activities;Manual techniques;Passive range of motion;Vasopneumatic Device   PT Next Visit Plan Right shoulder PROM; AAROM.  Modalities PRN. (MD. Onnie Graham 09/15/16)    PT Home Exercise Plan Continue ROM   Consulted and Agree with Plan of Care Patient      Patient will  benefit from skilled therapeutic intervention in order to improve the following deficits and impairments:  Pain, Decreased activity tolerance, Decreased range of motion  Visit Diagnosis: Chronic right shoulder pain - Plan: PT plan of care cert/re-cert  Stiffness of right shoulder, not elsewhere classified - Plan: PT plan of care cert/re-cert     Problem List Patient Active Problem List   Diagnosis Date Noted  . GERD (gastroesophageal reflux disease) 01/04/2014  . Chest pain 01/04/2014  . Diabetes mellitus without complication ([place])   . Hypertension     Oretha Caprice, MPT 10/03/2016, 2:37 PM  [facility] [place], [place], [address] Phone: [phone]   Fax:  [phone]  Name: David Holloway MRN: [id number] Date of Birth: 1961/06/06

## 2016-10-05 ENCOUNTER — Encounter: Payer: Self-pay | Admitting: Physical Therapy

## 2016-10-05 ENCOUNTER — Ambulatory Visit: Payer: Medicare PPO | Admitting: Physical Therapy

## 2016-10-05 DIAGNOSIS — M25611 Stiffness of right shoulder, not elsewhere classified: Secondary | ICD-10-CM

## 2016-10-05 DIAGNOSIS — G8929 Other chronic pain: Secondary | ICD-10-CM

## 2016-10-05 DIAGNOSIS — M25511 Pain in right shoulder: Principal | ICD-10-CM

## 2016-10-05 NOTE — Therapy (Signed)
[facility] [place], [place], [address] Phone: [phone]   Fax:  [phone]  Physical Therapy Treatment  Patient Details  Name: David Holloway MRN: [id number] Date of Birth: March 21, 1961 Referring Provider: Justice Britain MD.  Encounter Date: 10/05/2016      PT End of Session - 10/05/16 1320    Visit Number 13   Number of Visits 24   Date for PT Re-Evaluation 11/14/16   Authorization Type Sumitted Recert for 12 additional visits on 10/03/16   PT Start Time 1230   PT Stop Time 1315   PT Time Calculation (min) 45 min   Activity Tolerance Patient tolerated treatment well   Behavior During Therapy [facility] for tasks assessed/performed      Past Medical History:  Diagnosis Date  . Anxiety   . Diabetes mellitus without complication ([place])   . Hypertension     Past Surgical History:  Procedure Laterality Date  . APPENDECTOMY    . CHOLECYSTECTOMY    . COLOSTOMY REVERSAL      There were no vitals filed for this visit.          [facility] PT Assessment - 10/05/16 0001      Assessment   Medical Diagnosis Right shoulder SAD/DCR.   Onset Date/Surgical Date 08/11/16   Next MD Visit 10/18/16     Restrictions   Weight Bearing Restrictions No     ROM / Strength   AROM / PROM / Strength Strength                     [facility] Adult PT Treatment/Exercise - 10/05/16 0001      Shoulder Exercises: Supine   External Rotation AAROM;15 reps  with cane   Flexion AAROM;15 reps  with cane   Other Supine Exercises bicep curls with 4# weight x 15 reps, chest press with 4 # weight x 15 reps   Other Supine Exercises serratus punches toward ceiling x 15 reps, abduction pulls with green theraband in supine shoulder at 80 degrees flexion.      Shoulder Exercises: Pulleys   Flexion Limitations 6 minutes   Other Pulley Exercises Standing UE ranger 4 minutes     Modalities   Modalities Electrical  Stimulation;Ultrasound;Vasopneumatic     Moist Heat Therapy   Number Minutes Moist Heat 15 Minutes   Moist Heat Location Shoulder     Electrical Stimulation   Electrical Stimulation Location R shoulder   Electrical Stimulation Action  IFC   Electrical Stimulation Parameters 80-150 Hz x 15 minutes, intensity to tolerance   Electrical Stimulation Goals Pain     Manual Therapy   Manual Therapy Passive ROM;Soft tissue mobilization   Passive ROM  PROM of R shoulder into flexion/ER/IR with holds at end range and oscillations to promote proper mobility                PT Education - 10/05/16 1318    Education Details Instructed in continue use of cane/wand exercises at home   Person(s) Educated Patient   Methods Explanation   Comprehension Verbalized understanding;Returned demonstration             PT Long Term Goals - 10/05/16 1404      PT LONG TERM GOAL #1   Title Independent with a HEP.   Time 6   Period Weeks   Status Achieved     PT LONG TERM GOAL #2   Title Active right shoulder flexion to 150  degrees so the patient can easily reach overhead   Baseline Passive ROM R shoulder was 110 degrees today limited by pain 10/03/16   Time 6   Period Weeks   Status Not Met     PT LONG TERM GOAL #3   Title Active ER to 70 degrees+ to allow for easily donning/doffing of apparel   Baseline passive ER of R shoulder 60 degrees 10/03/16   Time 6   Period Weeks   Status Not Met     PT LONG TERM GOAL #4   Title Increase ROM so patient is able to reach behind back to L3.   Time 6   Period Weeks   Status Unable to assess     PT LONG TERM GOAL #5   Title Increase right shoulder strength to a solid 4+/5 to increase stability for performance of functional activities   Baseline 3/5 R shoulder strength grossly 10/03/16   Time 6   Period Weeks   Status Not Met     PT LONG TERM GOAL #6   Title Perform ADL's with right shoulder pain not > 3/10.   Baseline Patient still  reporting daily pain of >/= 6/10 with ADL's 10/03/16   Period Weeks   Status Not Met               Plan - 10/05/16 1358    Clinical Impression Statement patient arriving to therapy clinic today with no sling and pain reported at 8/10 in R shoulder. Pt with facial grimaces thoughout session. Pt tolerating soft tissue mobilizations with tenderness over R upper trap, , bicep and anterior deltoid. pt still progressing toward his goals set. Reported MD visit for a possible shoulder injection on 10/18/16. Continue skilled PT as pt tolerates to progress toward LTG's set.    Rehab Potential Excellent   PT Frequency 2x / week   PT Duration 6 weeks   PT Treatment/Interventions ADLs/Self Care Home Management;Cryotherapy;Electrical Stimulation;Moist Heat;Ultrasound;Patient/family education;Therapeutic exercise;Therapeutic activities;Manual techniques;Passive range of motion;Vasopneumatic Device   PT Next Visit Plan Right shoulder PROM; AAROM.  Modalities PRN. (MD. Onnie Graham 09/15/16)    PT Home Exercise Plan Continue ROM   Consulted and Agree with Plan of Care Patient   Family Member Consulted Wife      Patient will benefit from skilled therapeutic intervention in order to improve the following deficits and impairments:  Pain, Decreased activity tolerance, Decreased range of motion  Visit Diagnosis: Chronic right shoulder pain  Stiffness of right shoulder, not elsewhere classified     Problem List Patient Active Problem List   Diagnosis Date Noted  . GERD (gastroesophageal reflux disease) 01/04/2014  . Chest pain 01/04/2014  . Diabetes mellitus without complication ([place])   . Hypertension     Oretha Caprice, MPT 10/05/2016, 2:06 PM  [facility] [place], [place], [address] Phone: [phone]   Fax:  [phone]  Name: David Holloway MRN: [id number] Date of Birth: 01/31/1961

## 2016-10-10 ENCOUNTER — Ambulatory Visit: Payer: Medicare PPO | Admitting: Physical Therapy

## 2016-10-10 DIAGNOSIS — M25511 Pain in right shoulder: Secondary | ICD-10-CM | POA: Diagnosis not present

## 2016-10-10 DIAGNOSIS — G8929 Other chronic pain: Secondary | ICD-10-CM

## 2016-10-10 DIAGNOSIS — M25611 Stiffness of right shoulder, not elsewhere classified: Secondary | ICD-10-CM

## 2016-10-10 NOTE — Therapy (Signed)
[facility] [place], [place], [address] Phone: [phone]   Fax:  [phone]  Physical Therapy Treatment  Patient Details  Name: David Holloway MRN: [id number] Date of Birth: July 28, 1960 Referring Provider: Justice Britain MD.  Encounter Date: 10/10/2016      PT End of Session - 10/10/16 1355    Visit Number 14   Number of Visits 24   Date for PT Re-Evaluation 11/14/16   PT Start Time 0146   PT Stop Time 0233   PT Time Calculation (min) 47 min   Activity Tolerance Patient tolerated treatment well   Behavior During Therapy [facility] for tasks assessed/performed      Past Medical History:  Diagnosis Date  . Anxiety   . Diabetes mellitus without complication ([place])   . Hypertension     Past Surgical History:  Procedure Laterality Date  . APPENDECTOMY    . CHOLECYSTECTOMY    . COLOSTOMY REVERSAL      There were no vitals filed for this visit.      Subjective Assessment - 10/10/16 1356    Subjective My pain is a 10/10 today.   Patient Stated Goals use right arm without pain.   Pain Score 10-Worst pain ever   Pain Location Shoulder   Pain Orientation Right   Pain Descriptors / Indicators Sore;Tightness;Discomfort   Pain Type Surgical pain   Pain Onset More than a month ago   Multiple Pain Sites Yes                         [facility] Adult PT Treatment/Exercise - 10/10/16 0001      Shoulder Exercises: Pulleys   Flexion Limitations 5 minutes.   Other Pulley Exercises Wall ladder x 5 minutes.   Other Pulley Exercises Standing UE Ranger x 5 minutes     Shoulder Exercises: ROM/Strengthening   Other ROM/Strengthening Exercises RW 4 with yellow theraband to faitigue all motions.     Modalities   Modalities Electrical Stimulation     Moist Heat Therapy   Number Minutes Moist Heat 20 Minutes   Moist Heat Location --  Right shoulder.     Acupuncturist Location  --  Right shoulder.   Electrical Stimulation Action IFC   Electrical Stimulation Parameters 80-150 Hz x 20 minutes.   Electrical Stimulation Goals Pain                     PT Long Term Goals - 10/05/16 1404      PT LONG TERM GOAL #1   Title Independent with a HEP.   Time 6   Period Weeks   Status Achieved     PT LONG TERM GOAL #2   Title Active right shoulder flexion to 150 degrees so the patient can easily reach overhead   Baseline Passive ROM R shoulder was 110 degrees today limited by pain 10/03/16   Time 6   Period Weeks   Status Not Met     PT LONG TERM GOAL #3   Title Active ER to 70 degrees+ to allow for easily donning/doffing of apparel   Baseline passive ER of R shoulder 60 degrees 10/03/16   Time 6   Period Weeks   Status Not Met     PT LONG TERM GOAL #4   Title Increase ROM so patient is able to reach behind back to L3.   Time 6  Period Weeks   Status Unable to assess     PT LONG TERM GOAL #5   Title Increase right shoulder strength to a solid 4+/5 to increase stability for performance of functional activities   Baseline 3/5 R shoulder strength grossly 10/03/16   Time 6   Period Weeks   Status Not Met     PT LONG TERM GOAL #6   Title Perform ADL's with right shoulder pain not > 3/10.   Baseline Patient still reporting daily pain of >/= 6/10 with ADL's 10/03/16   Period Weeks   Status Not Met               Plan - 10/10/16 1401    Clinical Impression Statement I'm in a lot of pain.  My pain is a 10/10 today.      Patient will benefit from skilled therapeutic intervention in order to improve the following deficits and impairments:  Pain, Decreased activity tolerance, Decreased range of motion  Visit Diagnosis: Chronic right shoulder pain  Stiffness of right shoulder, not elsewhere classified     Problem List Patient Active Problem List   Diagnosis Date Noted  . GERD (gastroesophageal reflux disease) 01/04/2014  . Chest  pain 01/04/2014  . Diabetes mellitus without complication ([place])   . Hypertension     Katalea Ucci, [place] MPT 10/10/2016, 3:02 PM  [facility] [address] [place], [place], [address] Phone: [phone]   Fax:  [phone]  Name: JERMERY CARATACHEA MRN: [id number] Date of Birth: [birth year]

## 2016-10-12 ENCOUNTER — Ambulatory Visit: Payer: Medicare PPO | Admitting: Physical Therapy

## 2016-10-12 ENCOUNTER — Encounter: Payer: Self-pay | Admitting: Physical Therapy

## 2016-10-12 DIAGNOSIS — M25611 Stiffness of right shoulder, not elsewhere classified: Secondary | ICD-10-CM

## 2016-10-12 DIAGNOSIS — M25511 Pain in right shoulder: Secondary | ICD-10-CM | POA: Diagnosis not present

## 2016-10-12 DIAGNOSIS — G8929 Other chronic pain: Secondary | ICD-10-CM

## 2016-10-12 NOTE — Therapy (Signed)
[facility] [address] [place], [place], [address] Phone: [phone]   Fax:  [phone]  Physical Therapy Treatment  Patient Details  Name: David Holloway MRN: [id number] Date of Birth: May 18, 1961 Referring Provider: Francena Hanly MD.  Encounter Date: 10/12/2016      PT End of Session - 10/12/16 1243    Visit Number 15   Number of Visits 24   Date for PT Re-Evaluation 11/14/16   Authorization Type Sumitted Recert for 12 additional visits on 10/03/16   PT Start Time 1228   PT Stop Time 1314   PT Time Calculation (min) 46 min   Activity Tolerance Patient tolerated treatment well   Behavior During Therapy [facility] for tasks assessed/performed      Past Medical History:  Diagnosis Date  . Anxiety   . Diabetes mellitus without complication ([facility])   . Hypertension     Past Surgical History:  Procedure Laterality Date  . APPENDECTOMY    . CHOLECYSTECTOMY    . COLOSTOMY REVERSAL      There were no vitals filed for this visit.      Subjective Assessment - 10/12/16 1233    Subjective Patient continues to report a lot of pain   Patient Stated Goals use right arm without pain.   Currently in Pain? Yes   Pain Score 8    Pain Location Shoulder   Pain Orientation Right   Pain Descriptors / Indicators Sore;Discomfort   Pain Type Surgical pain   Pain Onset More than a month ago   Aggravating Factors  movement   Pain Relieving Factors rest/meds            [facility] PT Assessment - 10/12/16 0001      ROM / Strength   AROM / PROM / Strength AROM;PROM     AROM   AROM Assessment Site Shoulder   Right/Left Shoulder Right   Right Shoulder Flexion 100 Degrees   Right Shoulder External Rotation 34 Degrees     PROM   PROM Assessment Site Shoulder   Right/Left Shoulder Right   Right Shoulder Flexion 120 Degrees   Right Shoulder External Rotation 65 Degrees                     [facility] Adult PT Treatment/Exercise -  10/12/16 0001      Shoulder Exercises: Standing   Protraction Strengthening;Right;20 reps;Theraband   Theraband Level (Shoulder Protraction) Level 1 (Yellow)   External Rotation Strengthening;Right;20 reps;Theraband   Theraband Level (Shoulder External Rotation) Level 1 (Yellow)   Internal Rotation Strengthening;Right;Theraband   Theraband Level (Shoulder Internal Rotation) Level 1 (Yellow)   Row Strengthening;Right;20 reps;Theraband   Theraband Level (Shoulder Row) Level 1 (Yellow)     Shoulder Exercises: Pulleys   Flexion Limitations   Other Pulley Exercises Wall ladder x 5 minutes.   Other Pulley Exercises Standing UE Ranger x 5 minutes     Moist Heat Therapy   Number Minutes Moist Heat 15 Minutes   Moist Heat Location Shoulder     Electrical Stimulation   Electrical Stimulation Location R shoulder   Electrical Stimulation Action IFC   Electrical Stimulation Parameters 80-150hz  x69min   Electrical Stimulation Goals Pain     Manual Therapy   Manual Therapy Passive ROM   Passive ROM  PROM of R shoulder into flexion/ER/IR with holds at end range and oscillations to promote proper mobility  PT Long Term Goals - 10/12/16 1238      PT LONG TERM GOAL #1   Title Independent with a HEP.   Time 6   Period Weeks   Status Achieved     PT LONG TERM GOAL #2   Title Active right shoulder flexion to 150 degrees so the patient can easily reach overhead   Baseline Passive ROM R shoulder was 110 degrees today limited by pain 10/03/16   Time 6   Period Weeks   Status On-going  AROM 100 degrees 10/12/16     PT LONG TERM GOAL #3   Title Active ER to 70 degrees+ to allow for easily donning/doffing of apparel   Baseline passive ER of R shoulder 60 degrees 10/03/16   Time 6   Period Weeks   Status On-going     PT LONG TERM GOAL #4   Title Increase ROM so patient is able to reach behind back to L3.   Time 6   Period Weeks   Status On-going      PT LONG TERM GOAL #5   Title Increase right shoulder strength to a solid 4+/5 to increase stability for performance of functional activities   Baseline 3/5 R shoulder strength grossly 10/03/16   Period Weeks   Status On-going     PT LONG TERM GOAL #6   Title Perform ADL's with right shoulder pain not > 3/10.   Baseline Patient still reporting daily pain of >/= 6/10 with ADL's 10/03/16   Time 6   Period Weeks   Status On-going               Plan - 10/12/16 1301    Clinical Impression Statement Patient tolerated fairly well.Patient is limited by pain. Patient required education for daily self stretches. Patient is limited with ROM for right shoulder flexion, ER, and IR. Today focused on ROM. Patient current goals ongoing due to pain, ROM and strength deficts.    Rehab Potential Excellent   PT Frequency 2x / week   PT Duration 6 weeks   PT Treatment/Interventions ADLs/Self Care Home Management;Cryotherapy;Electrical Stimulation;Moist Heat;Ultrasound;Patient/family education;Therapeutic exercise;Therapeutic activities;Manual techniques;Passive range of motion;Vasopneumatic Device   PT Next Visit Plan Right shoulder PROM; AAROM.  Modalities PRN. (MD. Rennis ChrisSupple 10/18/16)    Consulted and Agree with Plan of Care Patient      Patient will benefit from skilled therapeutic intervention in order to improve the following deficits and impairments:  Pain, Decreased activity tolerance, Decreased range of motion  Visit Diagnosis: Chronic right shoulder pain  Stiffness of right shoulder, not elsewhere classified     Problem List Patient Active Problem List   Diagnosis Date Noted  . GERD (gastroesophageal reflux disease) 01/04/2014  . Chest pain 01/04/2014  . Diabetes mellitus without complication (H[facility])   . Hypertension     Abigael Mogle P, PTA 10/12/2016, 1:18 PM  [facility] [address] [place], [place], [id number] Phone: [phone]    Fax:  [phone]  Name: David HarderJeffrey W Holloway MRN: [id number] Date of Birth: 03/29/1961

## 2016-10-17 ENCOUNTER — Ambulatory Visit: Payer: Medicare PPO | Admitting: Physical Therapy

## 2016-10-17 ENCOUNTER — Encounter: Payer: Self-pay | Admitting: Physical Therapy

## 2016-10-17 DIAGNOSIS — M25511 Pain in right shoulder: Principal | ICD-10-CM

## 2016-10-17 DIAGNOSIS — M25611 Stiffness of right shoulder, not elsewhere classified: Secondary | ICD-10-CM

## 2016-10-17 DIAGNOSIS — G8929 Other chronic pain: Secondary | ICD-10-CM

## 2016-10-17 NOTE — Therapy (Signed)
[facility] [address] [place], [place], [address] Phone: [phone]   Fax:  [phone]  Physical Therapy Treatment  Patient Details  Name: LAZARO ISENHOWER MRN: [id number] Date of Birth: January 11, 1961 Referring Provider: Francena Hanly MD.  Encounter Date: 10/17/2016      PT End of Session - 10/17/16 1348    Visit Number 16   Number of Visits 24   Date for PT Re-Evaluation 11/14/16   PT Start Time 1345   PT Stop Time 1429   PT Time Calculation (min) 44 min   Activity Tolerance Patient limited by pain   Behavior During Therapy [facility] for tasks assessed/performed      Past Medical History:  Diagnosis Date  . Anxiety   . Diabetes mellitus without complication (HCC)   . Hypertension     Past Surgical History:  Procedure Laterality Date  . APPENDECTOMY    . CHOLECYSTECTOMY    . COLOSTOMY REVERSAL      There were no vitals filed for this visit.      Subjective Assessment - 10/17/16 1347    Subjective Reports that he is "still hurting" and sees Dr. Rennis Chris tomorrow.   Patient Stated Goals use right arm without pain.   Currently in Pain? Yes   Pain Score 8    Pain Location Shoulder   Pain Orientation Right   Pain Descriptors / Indicators Sore;Burning;Numbness   Pain Type Surgical pain   Pain Onset More than a month ago   Pain Frequency Constant            [facility] PT Assessment - 10/17/16 0001      Assessment   Medical Diagnosis Right shoulder SAD/DCR.   Onset Date/Surgical Date 08/11/16   Next MD Visit 10/18/16     Restrictions   Weight Bearing Restrictions No     ROM / Strength   AROM / PROM / Strength AROM;PROM     AROM   AROM Assessment Site Shoulder   Right/Left Shoulder Right   Right Shoulder Flexion 67 Degrees   Right Shoulder Internal Rotation 60 Degrees   Right Shoulder External Rotation 54 Degrees     PROM   Right Shoulder Flexion 70 Degrees   Right Shoulder External Rotation 60 Degrees                      [facility] Adult PT Treatment/Exercise - 10/17/16 0001      Shoulder Exercises: Supine   Flexion AAROM;Both;20 reps     Shoulder Exercises: Pulleys   Flexion Other (comment)  x5 min   Other Pulley Exercises Seated UE ranger flex/ CW and CCW circles x20 reps     Modalities   Modalities Electrical Stimulation;Moist Heat     Moist Heat Therapy   Number Minutes Moist Heat 15 Minutes   Moist Heat Location Shoulder     Electrical Stimulation   Electrical Stimulation Location R shoulder   Electrical Stimulation Action IFC   Electrical Stimulation Parameters 1-10 hz x15 min   Electrical Stimulation Goals Pain     Manual Therapy   Manual Therapy Passive ROM   Passive ROM  PROM of R shoulder into flexion/ER/IR with holds at end range and oscillations to promote proper mobility                     PT Long Term Goals - 10/12/16 1238      PT LONG TERM GOAL #1   Title  Independent with a HEP.   Time 6   Period Weeks   Status Achieved     PT LONG TERM GOAL #2   Title Active right shoulder flexion to 150 degrees so the patient can easily reach overhead   Baseline Passive ROM R shoulder was 110 degrees today limited by pain 10/03/16   Time 6   Period Weeks   Status On-going  AROM 100 degrees 10/12/16     PT LONG TERM GOAL #3   Title Active ER to 70 degrees+ to allow for easily donning/doffing of apparel   Baseline passive ER of R shoulder 60 degrees 10/03/16   Time 6   Period Weeks   Status On-going     PT LONG TERM GOAL #4   Title Increase ROM so patient is able to reach behind back to L3.   Time 6   Period Weeks   Status On-going     PT LONG TERM GOAL #5   Title Increase right shoulder strength to a solid 4+/5 to increase stability for performance of functional activities   Baseline 3/5 R shoulder strength grossly 10/03/16   Period Weeks   Status On-going     PT LONG TERM GOAL #6   Title Perform ADL's with right shoulder pain not >  3/10.   Baseline Patient still reporting daily pain of >/= 6/10 with ADL's 10/03/16   Time 6   Period Weeks   Status On-going               Plan - 10/17/16 1418    Clinical Impression Statement Patient tolerated today's treatment fair as he continues to present with high level R shoulder pain with reports of burning and numbness. Patient very limited with exercise secondary to pain and limited with PROM of R shoulder secondary to pain especially into flexion. PROM and AROM measurements of R shoulder decreased from previously documented measurements. Normal modalities response noted following removal of the modalities. Goals remain on-going secondary to pain limitations.   Rehab Potential Excellent   PT Frequency 2x / week   PT Duration 6 weeks   PT Next Visit Plan Right shoulder PROM; AAROM.  Modalities PRN. (MD. Rennis ChrisSupple 10/18/16)    PT Home Exercise Plan Continue ROM   Consulted and Agree with Plan of Care Patient      Patient will benefit from skilled therapeutic intervention in order to improve the following deficits and impairments:  Pain, Decreased activity tolerance, Decreased range of motion  Visit Diagnosis: Chronic right shoulder pain  Stiffness of right shoulder, not elsewhere classified     Problem List Patient Active Problem List   Diagnosis Date Noted  . GERD (gastroesophageal reflux disease) 01/04/2014  . Chest pain 01/04/2014  . Diabetes mellitus without complication (HCC)   . Hypertension     Florence CannerKelsey Parsons, PTA 10/17/16 6:10 PM [place]had Applegate MPT [facility] [address] [place], [place], [address] Phone: [phone]   Fax:  [phone]  Name: Renee HarderJeffrey W Mohamud MRN: [id number] Date of Birth: 09/12/1960

## 2016-10-24 ENCOUNTER — Encounter: Payer: Self-pay | Admitting: Physical Therapy

## 2016-10-24 ENCOUNTER — Ambulatory Visit: Payer: Medicare PPO | Attending: Orthopedic Surgery | Admitting: Physical Therapy

## 2016-10-24 DIAGNOSIS — M25511 Pain in right shoulder: Secondary | ICD-10-CM | POA: Diagnosis present

## 2016-10-24 DIAGNOSIS — M25611 Stiffness of right shoulder, not elsewhere classified: Secondary | ICD-10-CM | POA: Insufficient documentation

## 2016-10-24 DIAGNOSIS — G8929 Other chronic pain: Secondary | ICD-10-CM | POA: Diagnosis present

## 2016-10-24 NOTE — Therapy (Signed)
[facility] [address] [place], [place], [address] Phone: [phone]   Fax:  [phone]  Physical Therapy Treatment  Patient Details  Name: David Holloway MRN: [id number] Date of Birth: 02/18/1961 Referring Provider: Francena Hanly MD.  Encounter Date: 10/24/2016      PT End of Session - 10/24/16 1339    Visit Number 17   Number of Visits 24   Date for PT Re-Evaluation 11/14/16   Authorization Type Sumitted Recert for 12 additional visits on 10/03/16   PT Start Time 1300   PT Stop Time 1345   PT Time Calculation (min) 45 min   Activity Tolerance Patient tolerated treatment well   Behavior During Therapy [facility] for tasks assessed/performed      Past Medical History:  Diagnosis Date  . Anxiety   . Diabetes mellitus without complication (HCC)   . Hypertension     Past Surgical History:  Procedure Laterality Date  . APPENDECTOMY    . CHOLECYSTECTOMY    . COLOSTOMY REVERSAL      There were no vitals filed for this visit.      Subjective Assessment - 10/24/16 1306    Subjective Patient reporting he saw Dr Rennis Chris last week and he suggested that he continue with physical therapy and follow up with a visit in 4 weeks. Pt reported receiving a cortizone injection in his R posterior shoulder. Pt feels it helped a little, but arriving today to therapy with 8/10 pain.    Patient is accompained by: Family member   Patient Stated Goals use right arm without pain.   Currently in Pain? Yes   Pain Score 8    Pain Location Shoulder   Pain Orientation Right   Pain Descriptors / Indicators Sore;Burning;Numbness   Pain Frequency Constant   Aggravating Factors  movement   Pain Relieving Factors rest/meds   Effect of Pain on Daily Activities unable to perform household chores and ADL's. Pt reporting difficutly sleeping at night.    Multiple Pain Sites No                         [facility] Adult PT Treatment/Exercise -  10/24/16 0001      Shoulder Exercises: Standing   Protraction Strengthening;Right;15 reps   Theraband Level (Shoulder Protraction) Level 1 (Yellow)   External Rotation Strengthening;Right;15 reps;Theraband   Theraband Level (Shoulder External Rotation) Level 1 (Yellow)   Internal Rotation Strengthening;Right;15 reps;Theraband   Theraband Level (Shoulder Internal Rotation) Level 1 (Yellow)   Row Strengthening;Right;15 reps;Theraband   Theraband Level (Shoulder Row) Level 1 (Yellow)   Other Standing Exercises Therapy ball on wall with counter clockwise and clockwise circles, flexion     Shoulder Exercises: Pulleys   Flexion Limitations 4 minutes   Other Pulley Exercises Wall Ladder to #25 x 3 minutes   Other Pulley Exercises Seated UE ranger flex/ CW and CCW circles x 15 reps, diagonal planes x 15, abduction x 15 reps     Shoulder Exercises: ROM/Strengthening   Wall Pushups 15 reps     Modalities   Modalities Electrical Stimulation     Moist Heat Therapy   Number Minutes Moist Heat 15 Minutes   Moist Heat Location Shoulder     Electrical Stimulation   Electrical Stimulation Location R shoulder   Electrical Stimulation Action IFC   Electrical Stimulation Parameters 80-150 Hz x 15 minutes   Electrical Stimulation Goals Pain  PT Education - 10/24/16 1338    Education provided Yes   Education Details Instructed on continued mobility and exercises at home   Person(s) Educated Patient   Methods Explanation   Comprehension Verbalized understanding             PT Long Term Goals - 10/24/16 1341      PT LONG TERM GOAL #1   Title Independent with a HEP.   Time 6   Period Weeks   Status Achieved     PT LONG TERM GOAL #2   Title Active right shoulder flexion to 150 degrees so the patient can easily reach overhead   Baseline Passive ROM R shoulder was 110 degrees today limited by pain 10/03/16   Time 6   Period Weeks   Status On-going     PT LONG  TERM GOAL #3   Title Active ER to 70 degrees+ to allow for easily donning/doffing of apparel   Baseline passive ER of R shoulder 60 degrees 10/03/16   Time 6   Period Weeks   Status On-going     PT LONG TERM GOAL #4   Title Increase ROM so patient is able to reach behind back to L3.   Time 6   Period Weeks   Status On-going     PT LONG TERM GOAL #5   Title Increase right shoulder strength to a solid 4+/5 to increase stability for performance of functional activities   Baseline 3/5 R shoulder strength grossly 10/03/16   Period Weeks   Status On-going     PT LONG TERM GOAL #6   Title Perform ADL's with right shoulder pain not > 3/10.   Baseline Patient still reporting daily pain of >/= 6/10 with ADL's 10/03/16   Time 6   Period Weeks   Status On-going               Plan - 10/24/16 1339    Clinical Impression Statement Patient tolerated session today well. Pt still appearing with flat affect and reporting 8/10 pain in his R shoulder. Continue skilled PT per MD to decrease pain and improve overall pt function.     Rehab Potential Good   PT Frequency 2x / week   PT Duration 6 weeks   PT Treatment/Interventions ADLs/Self Care Home Management;Cryotherapy;Electrical Stimulation;Moist Heat;Ultrasound;Patient/family education;Therapeutic exercise;Therapeutic activities;Manual techniques;Passive range of motion;Vasopneumatic Device   PT Next Visit Plan Right shoulder PROM; AAROM.  Modalities PRN. (MD. Rennis Chris 10/18/16)    PT Home Exercise Plan Continue ROM   Consulted and Agree with Plan of Care Patient      Patient will benefit from skilled therapeutic intervention in order to improve the following deficits and impairments:  Pain, Decreased activity tolerance, Decreased range of motion  Visit Diagnosis: Chronic right shoulder pain  Stiffness of right shoulder, not elsewhere classified     Problem List Patient Active Problem List   Diagnosis Date Noted  . GERD  (gastroesophageal reflux disease) 01/04/2014  . Chest pain 01/04/2014  . Diabetes mellitus without complication (HCC)   . Hypertension     Sharmon Leyden, MPT  10/24/2016, 1:43 PM  [facility] [address] [place], [place], [address] Phone: [phone]   Fax:  [phone]  Name: David Holloway MRN: [id number] Date of Birth: 1960/11/30

## 2016-10-26 ENCOUNTER — Ambulatory Visit: Payer: Medicare PPO | Admitting: Physical Therapy

## 2016-10-26 ENCOUNTER — Encounter: Payer: Self-pay | Admitting: Physical Therapy

## 2016-10-26 DIAGNOSIS — M25511 Pain in right shoulder: Secondary | ICD-10-CM | POA: Diagnosis not present

## 2016-10-26 DIAGNOSIS — G8929 Other chronic pain: Secondary | ICD-10-CM

## 2016-10-26 DIAGNOSIS — M25611 Stiffness of right shoulder, not elsewhere classified: Secondary | ICD-10-CM

## 2016-10-26 NOTE — Therapy (Signed)
[facility] [place], [place], [address] Phone: [phone]   Fax:  [phone]  Physical Therapy Treatment  Patient Details  Name: David Holloway MRN: [id number] Date of Birth: 1961-01-03 Referring Provider: Justice Britain MD.  Encounter Date: 10/26/2016      PT End of Session - 10/26/16 1347    Visit Number 18   Number of Visits 24   Date for PT Re-Evaluation 11/14/16   Authorization Type Sumitted Recert for 12 additional visits on 10/03/16   PT Start Time 1300   PT Stop Time 1345   PT Time Calculation (min) 45 min   Activity Tolerance Patient tolerated treatment well   Behavior During Therapy [facility] for tasks assessed/performed      Past Medical History:  Diagnosis Date  . Anxiety   . Diabetes mellitus without complication ([place])   . Hypertension     Past Surgical History:  Procedure Laterality Date  . APPENDECTOMY    . CHOLECYSTECTOMY    . COLOSTOMY REVERSAL      There were no vitals filed for this visit.      Subjective Assessment - 10/26/16 1341    Subjective Patient reporting pain in R shoulder of 8/10 upon arrival. Pt reporting he has been doing his HEP daily.    Patient Stated Goals use right arm without pain.   Currently in Pain? Yes   Pain Score 7    Pain Location Shoulder   Pain Orientation Right   Pain Descriptors / Indicators Aching;Throbbing;Sore   Pain Type Surgical pain   Pain Onset More than a month ago   Pain Frequency Constant   Aggravating Factors  movement   Pain Relieving Factors rest/meds   Effect of Pain on Daily Activities unable to perform household chores and ADL's. Pt reporting difficutly sleeping at night.    Multiple Pain Sites No            [facility] PT Assessment - 10/26/16 0001      Assessment   Medical Diagnosis Right shoulder SAD/DCR.   Onset Date/Surgical Date 08/11/16   Next MD Visit 10/18/16     Restrictions   Weight Bearing Restrictions No     PROM   PROM  Assessment Site Shoulder   Right/Left Shoulder Right   Right Shoulder Flexion 70 Degrees   Right Shoulder External Rotation 60 Degrees                     [facility] Adult PT Treatment/Exercise - 10/26/16 0001      Shoulder Exercises: Seated   Other Seated Exercises Cybex, chest press, 1 plate x 10 reps     Shoulder Exercises: Standing   Protraction Strengthening;Right;15 reps   Theraband Level (Shoulder Protraction) Level 2 (Red)   External Rotation Strengthening;Right;15 reps;Theraband   Theraband Level (Shoulder External Rotation) Level 2 (Red)   Internal Rotation Strengthening;Right;15 reps;Theraband   Theraband Level (Shoulder Internal Rotation) Level 2 (Red)   Row Strengthening;Right;15 reps;Theraband   Theraband Level (Shoulder Row) Level 2 (Red)   Other Standing Exercises Therapy ball on wall with counter clockwise and clockwise circles, flexion     Shoulder Exercises: Pulleys   Flexion Limitations 4 minutes   Other Pulley Exercises Wall Ladder to #25 x 2 minutes   Other Pulley Exercises Standing UE ranger flex/ CW and CCW circles x 15 reps, diagonal planes x 15, abduction x 15 reps     Modalities   Modalities Cryotherapy;Printmaker  Cryotherapy   Number Minutes Cryotherapy 15 Minutes   Cryotherapy Location Shoulder   Type of Cryotherapy Ice pack     Electrical Stimulation   Electrical Stimulation Location R shoulder   Electrical Stimulation Action IFC   Electrical Stimulation Parameters 80-150 Hz x 15 minutes, intensity to tolerance   Electrical Stimulation Goals Pain                PT Education - 10/26/16 1346    Education provided Yes   Education Details Exercise technique   Person(s) Educated Patient   Methods Explanation;Demonstration   Comprehension Verbalized understanding;Returned demonstration             PT Long Term Goals - 10/26/16 1348      PT LONG TERM GOAL #1   Title Independent with a HEP.   Time 6    Period Weeks   Status Achieved     PT LONG TERM GOAL #2   Title Active right shoulder flexion to 150 degrees so the patient can easily reach overhead   Baseline Passive ROM R shoulder was 110 degrees today limited by pain 10/03/16   Time 6   Period Weeks   Status On-going     PT LONG TERM GOAL #3   Title Active ER to 70 degrees+ to allow for easily donning/doffing of apparel   Baseline passive ER of R shoulder 70 degrees 10/26/16   Time 6   Period Weeks   Status Partially Met     PT LONG TERM GOAL #4   Title Increase ROM so patient is able to reach behind back to L3.   Time 6   Period Weeks   Status On-going     PT LONG TERM GOAL #5   Title Increase right shoulder strength to a solid 4+/5 to increase stability for performance of functional activities   Baseline 3/5 R shoulder strength grossly 10/03/16   Period Weeks   Status On-going     PT LONG TERM GOAL #6   Title Perform ADL's with right shoulder pain not > 3/10.   Baseline Patient still reporting daily pain of >/= 6/10 with ADL's 10/03/16   Time 6   Period Weeks   Status On-going               Plan - 10/26/16 1405    Clinical Impression Statement Patient tolerated session today. Pt reporting 7/10 pain at end of session. Pt with improved PROM of ER of his R shoulder to 70 degrees. pt still progresssing toward goals set for decreased pain, strength and ROM. Continue with skilled PT.    Rehab Potential Good   PT Frequency 1x / week   PT Duration 6 weeks   PT Treatment/Interventions ADLs/Self Care Home Management;Cryotherapy;Electrical Stimulation;Moist Heat;Ultrasound;Patient/family education;Therapeutic exercise;Therapeutic activities;Manual techniques;Passive range of motion;Vasopneumatic Device   PT Next Visit Plan Right shoulder ROM and strengthening exercises.  Modalities PRN. (MD. Onnie Graham 10/18/16)    PT Home Exercise Plan Continue ROM, strengthening exercises   Consulted and Agree with Plan of Care Patient       Patient will benefit from skilled therapeutic intervention in order to improve the following deficits and impairments:  Pain, Decreased activity tolerance, Decreased range of motion  Visit Diagnosis: Chronic right shoulder pain  Stiffness of right shoulder, not elsewhere classified     Problem List Patient Active Problem List   Diagnosis Date Noted  . GERD (gastroesophageal reflux disease) 01/04/2014  . Chest pain 01/04/2014  . Diabetes  mellitus without complication ([place])   . Hypertension     Oretha Caprice, MPT 10/26/2016, 2:07 PM  [facility] [place], [place], [address] Phone: [phone]   Fax:  [phone]  Name: KOLT MCWHIRTER MRN: [id number] Date of Birth: 1960-09-23

## 2016-10-31 ENCOUNTER — Encounter: Payer: Medicare PPO | Admitting: Physical Therapy

## 2016-11-02 ENCOUNTER — Ambulatory Visit: Payer: Medicare PPO | Admitting: Physical Therapy

## 2016-11-02 ENCOUNTER — Encounter: Payer: Self-pay | Admitting: Physical Therapy

## 2016-11-02 DIAGNOSIS — G8929 Other chronic pain: Secondary | ICD-10-CM

## 2016-11-02 DIAGNOSIS — M25511 Pain in right shoulder: Principal | ICD-10-CM

## 2016-11-02 DIAGNOSIS — M25611 Stiffness of right shoulder, not elsewhere classified: Secondary | ICD-10-CM

## 2016-11-02 NOTE — Therapy (Signed)
[facility] [address] [place], [place], [address] Phone: [phone]   Fax:  [phone]  Physical Therapy Treatment  Patient Details  Name: David Holloway MRN: [id number] Date of Birth: 03/17/1961 Referring Provider: Kevin Supple MD.  Encounter Date: 11/02/2016      PT End of Session - 11/02/16 1121    Visit Number 19   Number of Visits 24   Date for PT Re-Evaluation 11/14/16   Authorization Type Sumitted Recert for 12 additional visits on 10/03/16   PT Start Time 1117   PT Stop Time 1201   PT Time Calculation (min) 44 min   Activity Tolerance Patient tolerated treatment well   Behavior During Therapy [facility] for tasks assessed/performed      Past Medical History:  Diagnosis Date  . Anxiety   . Diabetes mellitus without complication (HCC)   . Hypertension     Past Surgical History:  Procedure Laterality Date  . APPENDECTOMY    . CHOLECYSTECTOMY    . COLOSTOMY REVERSAL      There were no vitals filed for this visit.      Subjective Assessment - 11/02/16 1120    Subjective Reports that his shoulder was better for a few days following surgery but is now back to the same.   Patient Stated Goals use right arm without pain.   Currently in Pain? Yes   Pain Score 8    Pain Location Shoulder   Pain Orientation Right   Pain Descriptors / Indicators Tingling;Burning   Pain Type Surgical pain   Pain Onset More than a month ago            [facility] PT Assessment - 11/02/16 0001      Assessment   Medical Diagnosis Right shoulder SAD/DCR.   Onset Date/Surgical Date 08/11/16   Next MD Visit 11/13/2016     Restrictions   Weight Bearing Restrictions No                     [facility] Adult PT Treatment/Exercise - 11/02/16 0001      Shoulder Exercises: Supine   External Rotation AAROM;Right;20 reps   Flexion AAROM;Both;20 reps     Shoulder Exercises: Pulleys   Flexion Other (comment)  x5 min   Other Pulley  Exercises Wall Ladder to #26 x 2 minutes   Other Pulley Exercises Standing UE ranger flex/ CW and CCW circles x 20 reps     Modalities   Modalities Cryotherapy;Electrical Stimulation     Cryotherapy   Number Minutes Cryotherapy 15 Minutes   Cryotherapy Location Shoulder   Type of Cryotherapy Ice pack     Electrical Stimulation   Electrical Stimulation Location R shoulder   Electrical Stimulation Action Pre-mod   Electrical Stimulation Parameters 80-150 hz x15 min   Electrical Stimulation Goals Pain     Manual Therapy   Manual Therapy Passive ROM   Passive ROM  PROM of R shoulder into flexion/ER/IR with holds at end range                     PT Long Term Goals - 10/26/16 1348      PT LONG TERM GOAL #1   Title Independent with a HEP.   Time 6   Period Weeks   Status Achieved     PT LONG TERM GOAL #2   Title Active right shoulder flexion to 150 degrees so the patient can easily reach overhead     Baseline Passive ROM R shoulder was 110 degrees today limited by pain 10/03/16   Time 6   Period Weeks   Status On-going     PT LONG TERM GOAL #3   Title Active ER to 70 degrees+ to allow for easily donning/doffing of apparel   Baseline passive ER of R shoulder 70 degrees 10/26/16   Time 6   Period Weeks   Status Partially Met     PT LONG TERM GOAL #4   Title Increase ROM so patient is able to reach behind back to L3.   Time 6   Period Weeks   Status On-going     PT LONG TERM GOAL #5   Title Increase right shoulder strength to a solid 4+/5 to increase stability for performance of functional activities   Baseline 3/5 R shoulder strength grossly 10/03/16   Period Weeks   Status On-going     PT LONG TERM GOAL #6   Title Perform ADL's with right shoulder pain not > 3/10.   Baseline Patient still reporting daily pain of >/= 6/10 with ADL's 10/03/16   Time 6   Period Weeks   Status On-going               Plan - 11/02/16 1152    Clinical Impression  Statement Patient tolerated today's treatment fair as he continues to experience high level R shoulder pain following a period of relief after receiving injection at last MD appointment. Patient able to complete exercises as directed but with stiffness especially in flexion. Patient required tactile and verbal cueing for proper technique for AAROM ER. Very firm end feel noted with PROM of R shoulder into flexion at 97 deg. Good and smooth arc of motion of R shoulder into ER with normal firm end feel noted. Normal modalities response noted following removal of the modalities.   Rehab Potential Good   PT Frequency 1x / week   PT Duration 6 weeks   PT Treatment/Interventions ADLs/Self Care Home Management;Cryotherapy;Electrical Stimulation;Moist Heat;Ultrasound;Patient/family education;Therapeutic exercise;Therapeutic activities;Manual techniques;Passive range of motion;Vasopneumatic Device   PT Next Visit Plan Right shoulder ROM and strengthening exercises.  Modalities PRN. (MD. Supple 10/18/16)    PT Home Exercise Plan Continue ROM, strengthening exercises   Consulted and Agree with Plan of Care Patient      Patient will benefit from skilled therapeutic intervention in order to improve the following deficits and impairments:  Pain, Decreased activity tolerance, Decreased range of motion  Visit Diagnosis: Chronic right shoulder pain  Stiffness of right shoulder, not elsewhere classified     Problem List Patient Active Problem List   Diagnosis Date Noted  . GERD (gastroesophageal reflux disease) 01/04/2014  . Chest pain 01/04/2014  . Diabetes mellitus without complication (HCC)   . Hypertension      M Parsons, PTA 11/02/2016, 12:05 PM  [facility] [address] [place], [place], [address] Phone: [phone]   Fax:  [phone]  Name: Wojciech W Macbeth MRN: [id number] Date of Birth: 04/07/1961   

## 2016-11-07 ENCOUNTER — Ambulatory Visit: Payer: Medicare PPO | Admitting: Physical Therapy

## 2016-11-07 ENCOUNTER — Encounter: Payer: Self-pay | Admitting: Physical Therapy

## 2016-11-07 DIAGNOSIS — M25611 Stiffness of right shoulder, not elsewhere classified: Secondary | ICD-10-CM

## 2016-11-07 DIAGNOSIS — M25511 Pain in right shoulder: Secondary | ICD-10-CM | POA: Diagnosis not present

## 2016-11-07 DIAGNOSIS — G8929 Other chronic pain: Secondary | ICD-10-CM

## 2016-11-07 NOTE — Therapy (Signed)
[facility] [place], [place], [address] Phone: [phone]   Fax:  [phone]  Physical Therapy Treatment  Patient Details  Name: David Holloway MRN: [id number] Date of Birth: 1960/11/11 Referring Provider: Justice Britain MD.  Encounter Date: 11/07/2016      PT End of Session - 11/07/16 1408    Visit Number 20   Number of Visits 24   Date for PT Re-Evaluation 11/14/16   Authorization Type Sumitted Recert for 12 additional visits on 10/03/16   PT Start Time 1230   PT Stop Time 1305   PT Time Calculation (min) 35 min   Activity Tolerance Patient tolerated treatment well   Behavior During Therapy [facility] for tasks assessed/performed      Past Medical History:  Diagnosis Date  . Anxiety   . Diabetes mellitus without complication ([place])   . Hypertension     Past Surgical History:  Procedure Laterality Date  . APPENDECTOMY    . CHOLECYSTECTOMY    . COLOSTOMY REVERSAL      There were no vitals filed for this visit.      Subjective Assessment - 11/07/16 1405    Subjective Patient arriving to therapy today reporting 10/10 pain. Pt reporting his pain has increased over the weekend. Pt asked did he want to continiue with therapy today, pt agreeing to STW and IFC.    Patient Stated Goals use right arm without pain.   Currently in Pain? Yes   Pain Score 10-Worst pain ever   Pain Location Shoulder   Pain Orientation Right   Pain Descriptors / Indicators Throbbing;Sore;Constant   Pain Type Surgical pain   Pain Onset More than a month ago   Pain Frequency Constant   Aggravating Factors  movements   Pain Relieving Factors rest/meds, but not completely   Effect of Pain on Daily Activities difficulty sleeping and performing Household chores   Multiple Pain Sites No            [facility] PT Assessment - 11/07/16 0001      Assessment   Medical Diagnosis Right shoulder SAD/DCR.   Onset Date/Surgical Date 08/11/16   Next  MD Visit 11/13/2016     Restrictions   Weight Bearing Restrictions No                     [facility] Adult PT Treatment/Exercise - 11/07/16 0001      Shoulder Exercises: Supine   External Rotation --   Flexion --     Shoulder Exercises: Standing   Protraction --   Theraband Level (Shoulder Protraction) --   External Rotation --   Theraband Level (Shoulder External Rotation) --   Internal Rotation --   Theraband Level (Shoulder Internal Rotation) --   Row Strengthening;15 reps;Theraband   Theraband Level (Shoulder Row) Level 2 (Red)     Shoulder Exercises: Pulleys   Flexion Limitations --   Other Pulley Exercises --   Other Pulley Exercises --     Shoulder Exercises: Therapy Ball   Other Therapy Ball Exercises --     Modalities   Modalities Social worker Location R shoulder   Electrical Stimulation Action Pre-mod   Electrical Stimulation Parameters 80-150 Hz, x 15 minutes intensity to tolerance   Electrical Stimulation Goals Pain     Manual Therapy   Manual Therapy Soft tissue mobilization   Manual therapy comments Pt with trigger points noted in  R bicep, R pectoralis, and R upper trap  performed to pt's tolerance                     PT Long Term Goals - 11/07/16 1409      PT LONG TERM GOAL #1   Title Independent with a HEP.   Period Weeks   Status Achieved     PT LONG TERM GOAL #2   Title Active right shoulder flexion to 150 degrees so the patient can easily reach overhead   Baseline Passive ROM R shoulder was 110 degrees today limited by pain 10/03/16   Time 6   Period Weeks   Status On-going     PT LONG TERM GOAL #3   Title Active ER to 70 degrees+ to allow for easily donning/doffing of apparel   Baseline passive ER of R shoulder 70 degrees 10/26/16   Time 6   Period Weeks   Status Partially Met     PT LONG TERM GOAL #4   Title Increase ROM so patient is able to reach  behind back to L3.   Time 6   Period Weeks   Status On-going     PT LONG TERM GOAL #5   Title Increase right shoulder strength to a solid 4+/5 to increase stability for performance of functional activities   Baseline 3/5 R shoulder strength grossly 10/03/16   Time 6   Period Weeks   Status On-going     PT LONG TERM GOAL #6   Title Perform ADL's with right shoulder pain not > 3/10.   Baseline Patient still reporting daily pain of >/= 6/10 with ADL's 10/03/16   Time 6   Period Weeks   Status On-going               Plan - 11/07/16 1411    Clinical Impression Statement Patient arriving to therapy today reporting 10/10 pain in his R shoulder. Performed STW to R shoulder with tenderness noted over R upper trap, R pectoralis, R bicep, R anterior deltoid. [place]gger points noted to R Upper Trap, R bicep, and R pectoralis. Following STW and IFC, pt reporting decreased pain to 8/10. Pt with follow up with MD on 11/15/16 per pt report. Continue with skilled PT as pt tolerates.    Rehab Potential Good   PT Frequency 1x / week   PT Duration 6 weeks   PT Treatment/Interventions ADLs/Self Care Home Management;Cryotherapy;Electrical Stimulation;Moist Heat;Ultrasound;Patient/family education;Therapeutic exercise;Therapeutic activities;Manual techniques;Passive range of motion;Vasopneumatic Device   PT Next Visit Plan Right shoulder ROM and strengthening exercises.  Modalities PRN. (MD. Onnie Graham 10/18/16)    PT Home Exercise Plan Continue ROM, strengthening exercises, Manual, E-stim as pt tolerates   Consulted and Agree with Plan of Care Patient      Patient will benefit from skilled therapeutic intervention in order to improve the following deficits and impairments:  Pain, Decreased activity tolerance, Decreased range of motion  Visit Diagnosis: Chronic right shoulder pain  Stiffness of right shoulder, not elsewhere classified     Problem List Patient Active Problem List   Diagnosis Date  Noted  . GERD (gastroesophageal reflux disease) 01/04/2014  . Chest pain 01/04/2014  . Diabetes mellitus without complication ([place])   . Hypertension     Oretha Caprice, MPT 11/07/2016, 2:15 PM  [facility] [place], [place], [address] Phone: [phone]   Fax:  [phone]  Name: David Holloway MRN: [id number] Date of Birth:  [birth year]

## 2016-11-09 ENCOUNTER — Ambulatory Visit: Payer: Medicare PPO | Admitting: Physical Therapy

## 2016-11-09 DIAGNOSIS — G8929 Other chronic pain: Secondary | ICD-10-CM

## 2016-11-09 DIAGNOSIS — M25511 Pain in right shoulder: Principal | ICD-10-CM

## 2016-11-09 DIAGNOSIS — M25611 Stiffness of right shoulder, not elsewhere classified: Secondary | ICD-10-CM

## 2016-11-09 NOTE — Therapy (Signed)
[facility] [address] [place], [place], [address] Phone: [phone]   Fax:  [phone]  Patient Details  Name: David Holloway MRN: [id number] Date of Birth: Aug 30, 1960 Referring Provider:  Mickle Plumb, NP  Encounter Date: 11/09/2016  Pt arriving to Physical Therapy today still reporting 10/10 pain with movements. Pt with tenderness noted over [facility] joint and bicep tendon of R UE. We discussed possible dry needling to his R pectoralis and R bicep,  but I feel the pt would benefit from further evaluation of his R shoulder pain by MD.  Pt has made some improvements since beginning therapy in R shoulder flexion to 130 degrees actively and 62 degrees of ER passively. Pt reporting he has a follow up with Dr. Francena Hanly on 11/15/16. Pt instructed to hold PT until follow up with MD.         Sharmon Leyden, MPT 11/09/2016, 1:10 PM  [facility] [address] [place], [place], [address] Phone: [phone]   Fax:  [phone]

## 2017-01-16 ENCOUNTER — Other Ambulatory Visit: Payer: Self-pay | Admitting: Orthopedic Surgery

## 2017-01-16 DIAGNOSIS — Z4789 Encounter for other orthopedic aftercare: Secondary | ICD-10-CM

## 2017-02-01 ENCOUNTER — Ambulatory Visit
Admission: RE | Admit: 2017-02-01 | Discharge: 2017-02-01 | Disposition: A | Discharge status: Home / Self Care | Payer: Medicare PPO | Source: Ambulatory Visit | Attending: Orthopedic Surgery | Admitting: Orthopedic Surgery

## 2017-02-01 DIAGNOSIS — Z4789 Encounter for other orthopedic aftercare: Secondary | ICD-10-CM

## 2017-02-01 MED ORDER — IOPAMIDOL (ISOVUE-M 200) INJECTION 41%
15.0000 mL | Freq: Once | INTRAMUSCULAR | Status: AC
  Administered 2017-02-01: 15 mL via INTRA_ARTICULAR

## 2017-04-27 LAB — BASIC METABOLIC PANEL: GLUCOSE: 147

## 2018-09-09 IMAGING — XA DG FLUORO GUIDE NDL PLC/BX
2 series · 2 of 2 positions shown · non-contrast
Comparison: none

CLINICAL DATA: Right shoulder pain despite previous surgery.

[Series 1: ortho standard · 1 of 1 slices shown (1 of 2)]
[im 1/1]
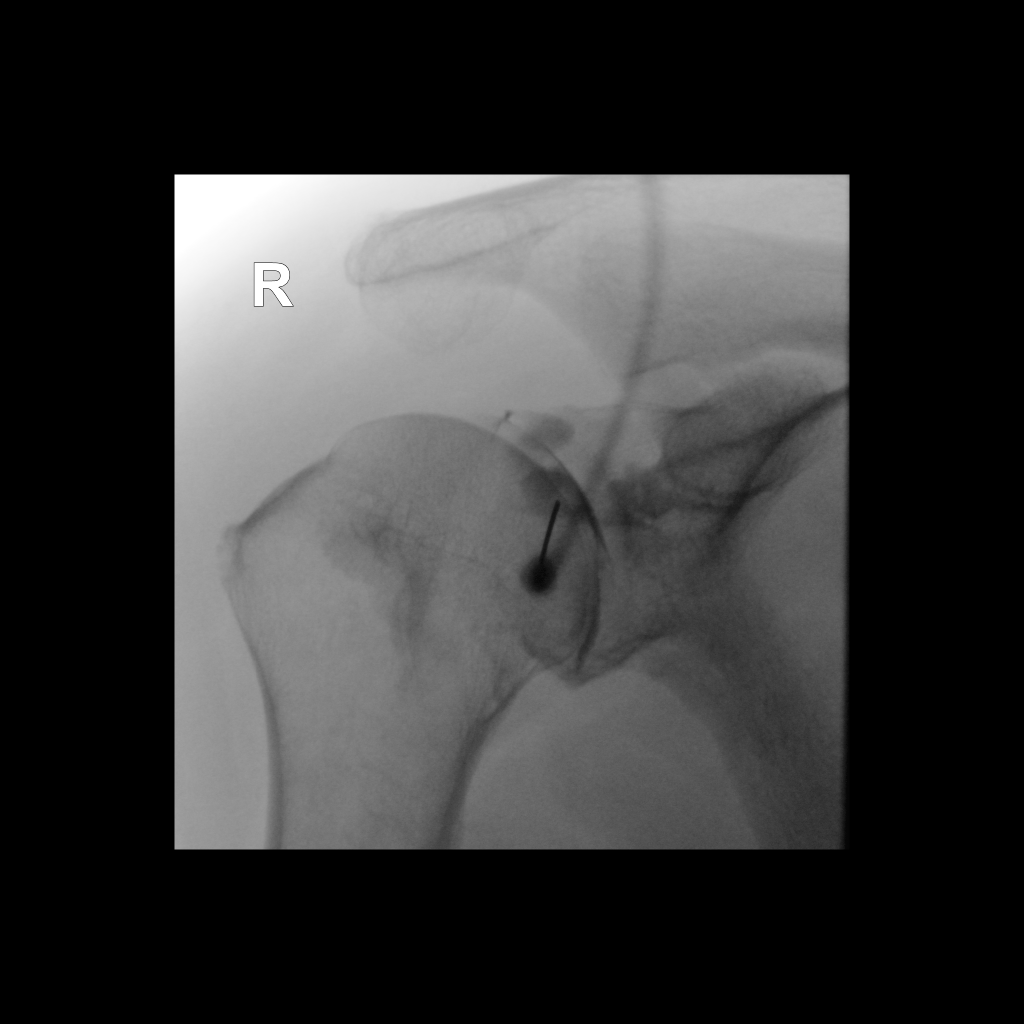

[Series 2: ortho standard · 1 of 1 slices shown (2 of 2)]
[im 1/1]
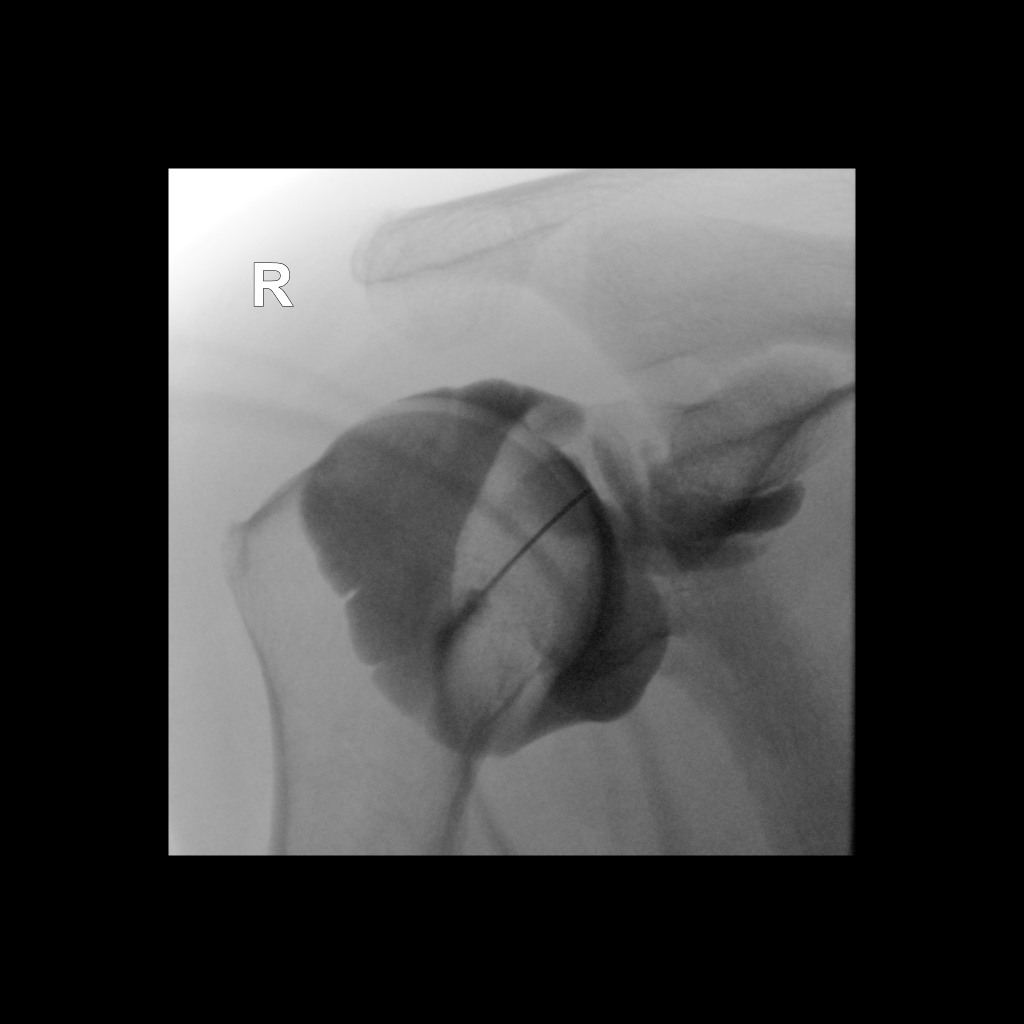

[2 of 2 positions shown; findings below may reference images not displayed]

FLUOROSCOPY TIME:  Radiation Exposure Index (as provided by the
fluoroscopic device): 11.25 uGy*m2

If the device does not provide the exposure index:

Fluoroscopy Time:  12 seconds

Number of Acquired Images:  0

PROCEDURE:
Right SHOULDER INJECTION UNDER FLUOROSCOPY

An appropriate skin entrance site was determined. The site was
marked, prepped with Betadine, draped in the usual sterile fashion,
and infiltrated locally with buffered Lidocaine. 22 gauge spinal
needle was advanced to the superomedial margin of the humeral head
under intermittent fluoroscopy. 1 ml of Lidocaine injected easily. A
mixture of 0.1 ml Multihance and 20 ml of dilute Isovue-M 200 was
then used to opacify the right shoulder capsule. No immediate
complication.
IMPRESSION: Technically successful right shoulder injection for MRI.
